# Patient Record
Sex: Male | Born: 2000
Health system: Southern US, Community
[De-identification: ages and names within clinical notes are randomized; demographics above are authoritative.]

## PROBLEM LIST (undated history)

## (undated) DIAGNOSIS — Q068 Other specified congenital malformations of spinal cord: Secondary | ICD-10-CM

## (undated) DIAGNOSIS — G43909 Migraine, unspecified, not intractable, without status migrainosus: Secondary | ICD-10-CM

## (undated) DIAGNOSIS — H6690 Otitis media, unspecified, unspecified ear: Secondary | ICD-10-CM

## (undated) DIAGNOSIS — F909 Attention-deficit hyperactivity disorder, unspecified type: Secondary | ICD-10-CM

## (undated) DIAGNOSIS — J45909 Unspecified asthma, uncomplicated: Secondary | ICD-10-CM

## (undated) HISTORY — PX: BACK SURGERY: SHX140

## (undated) HISTORY — PX: OTHER SURGICAL HISTORY: SHX169

---
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
## 2021-01-23 ENCOUNTER — Telehealth: Payer: Self-pay

## 2021-01-23 NOTE — Telephone Encounter (Signed)
Error

## 2021-01-23 NOTE — Telephone Encounter (Signed)
Caller name:Clayton Avery   On DPR? :Yes  Call back [phone]  Provider they see: Janeece Agee   Reason for call:Pt mother called in requesting refill for pt I explain to mother a DPR would have to be on file to discuss medical. I called the pt and LVM to return my phone call pt needs to schedule a Med Check in order to get adderall

## 2021-01-25 ENCOUNTER — Other Ambulatory Visit: Payer: Self-pay

## 2021-01-25 ENCOUNTER — Ambulatory Visit (INDEPENDENT_AMBULATORY_CARE_PROVIDER_SITE_OTHER): Payer: No Typology Code available for payment source | Admitting: Registered Nurse

## 2021-01-25 ENCOUNTER — Encounter: Payer: Self-pay | Admitting: Registered Nurse

## 2021-01-25 VITALS — BP 126/62 | HR 79 | Temp 98.3°F | Resp 18 | Ht 74.0 in | Wt 166.8 lb

## 2021-01-25 DIAGNOSIS — F909 Attention-deficit hyperactivity disorder, unspecified type: Secondary | ICD-10-CM

## 2021-01-25 MED ORDER — AMPHETAMINE-DEXTROAMPHETAMINE 20 MG PO TABS: 20.0000 mg | ORAL_TABLET | Freq: Every day | ORAL | 0 refills | Status: DC

## 2021-01-25 NOTE — Patient Instructions (Signed)
Mr. Rishabh Mcmurdie to see you  Restart adderall 20mg  daily Ok to split tabs in half and use 1/2 tab twice daily. Let me know if not working out [employer] you a message on MyChart in 3-4 weeks   Thanks,  Denice Paradise

## 2021-01-25 NOTE — Progress Notes (Signed)
Established Patient Office Visit  Subjective:  Patient ID: Clayton Avery, male    DOB: 2000-06-05  Age: 21 y.o. MRN: [id number]  CC:  Chief Complaint  Patient presents with   Medication Refill    Patient states he is here to discuss medication for ADHD . Patient states he was dx before but stopped when he started college. Pt states he is having a hard time in school.    HPI [name] presents for ADHD  Had been medicated in the past - as recently as 1 year ago .  Had been on Adderall 20mg  IR po qd in the past. Good effect.  Would like to resume  Has had some anxieties recently - have trialed meds without relief.  Notes some of his anxieties seem to be related to his inability to focus.  Past Medical History:  Diagnosis Date   ADHD (attention deficit hyperactivity disorder)    ADHD (attention deficit hyperactivity disorder)    Asthma    Migraine    Otitis    Tethered cord ([facility])     Past Surgical History:  Procedure Laterality Date   BACK SURGERY     LUMBAR LAMINECTOMY FOR TETHERED CORD RELEASE  2003   spinal cord surgery     tubes in ears      History reviewed. No pertinent family history.  Social History   Socioeconomic History   Marital status: Single    Spouse name: Not on file   Number of children: Not on file   Years of education: Not on file   Highest education level: Not on file  Occupational History   Not on file  Tobacco Use   Smoking status: Never   Smokeless tobacco: Never  Vaping Use   Vaping Use: Never used  Substance and Sexual Activity   Alcohol use: Yes    Comment: 3 x weekly   Drug use: Yes    Types: Marijuana    Comment: occasional   Sexual activity: Not Currently  Other Topics Concern   Not on file  Social History Narrative   ** Merged History Encounter **       Social Determinants of Health   Financial Resource Strain: Not on file  Food Insecurity: Not on file  Transportation Needs: Not on file   Physical Activity: Not on file  Stress: Not on file  Social Connections: Not on file  Intimate Partner Violence: Not on file    Outpatient Medications Prior to Visit  Medication Sig Dispense Refill   albuterol (VENTOLIN HFA) 108 (90 Base) MCG/ACT inhaler Inhale 2 puffs into the lungs every 6 (six) hours as needed for wheezing or shortness of breath. (Patient not taking: Reported on 12/27/2020) 8 g 0   busPIRone (BUSPAR) 5 MG tablet Take 1 tablet (5 mg total) by mouth 3 (three) times daily as needed (anxiety). (Patient not taking: Reported on 01/25/2021) 15 tablet 0   dicyclomine (BENTYL) 20 MG tablet Take 1 tablet (20 mg total) by mouth 3 (three) times daily as needed for spasms (cramping pain). (Patient not taking: Reported on 12/27/2020) 20 tablet 0   mirtazapine (REMERON) 7.5 MG tablet Take 1 tablet (7.5 mg total) by mouth at bedtime. (Patient not taking: Reported on 01/25/2021) 7 tablet 0   naproxen (NAPROSYN) 500 MG tablet Take 1 tablet (500 mg total) by mouth 2 (two) times daily with a meal. (Patient not taking: Reported on 01/25/2021) 30 tablet 0   ondansetron (ZOFRAN ODT) 4  MG disintegrating tablet Take 1 tablet (4 mg total) by mouth every 8 (eight) hours as needed. (Patient not taking: Reported on 12/27/2020) 20 tablet 0   Facility-Administered Medications Prior to Visit  Medication Dose Route Frequency Provider Last Rate Last Admin   ipratropium-albuterol (DUONEB) 0.5-2.5 (3) MG/3ML nebulizer solution 3 mL  3 mL Nebulization Once Janeece Agee, NP        No Known Allergies  ROS Review of Systems Per hpi    Objective:    Physical Exam Constitutional:      General: He is not in acute distress.    Appearance: Normal appearance. He is normal weight. He is not ill-appearing, toxic-appearing or diaphoretic.  Cardiovascular:     Rate and Rhythm: Normal rate and regular rhythm.     Heart sounds: Normal heart sounds. No murmur heard.   No friction rub. No gallop.  Pulmonary:      Effort: Pulmonary effort is normal. No respiratory distress.     Breath sounds: Normal breath sounds. No stridor. No wheezing, rhonchi or rales.  Chest:     Chest wall: No tenderness.  Neurological:     General: No focal deficit present.     Mental Status: He is alert and oriented to person, place, and time. Mental status is at baseline.  Psychiatric:        Mood and Affect: Mood normal.        Behavior: Behavior normal.        Thought Content: Thought content normal.        Judgment: Judgment normal.    BP 126/62    Pulse 79    Temp 98.3 F (36.8 C) (Temporal)    Resp 18    Ht 6\' 2"  (1.88 m)    Wt 166 lb 12.8 oz (75.7 kg)    SpO2 98%    BMI 21.42 kg/m  Wt Readings from Last 3 Encounters:  01/25/21 166 lb 12.8 oz (75.7 kg)  12/27/20 158 lb 6.4 oz (71.8 kg) (55 %, Z= 0.11)*  11/20/20 170 lb (77.1 kg) (71 %, Z= 0.54)*   * Growth percentiles are based on CDC (Boys, 2-20 Years) data.     Health Maintenance Due  Topic Date Due   HPV VACCINES (1 - Male 2-dose series) Never done   COVID-19 Vaccine (2 - Pfizer series) 05/07/2019   TETANUS/TDAP  Never done   INFLUENZA VACCINE  Never done       Topic Date Due   HPV VACCINES (1 - Male 2-dose series) Never done    Lab Results  Component Value Date   TSH 0.566 07/21/2019   Lab Results  Component Value Date   WBC 9.5 11/20/2020   HGB 16.8 11/20/2020   HCT 49.4 11/20/2020   MCV 83.9 11/20/2020   PLT 210 11/20/2020   Lab Results  Component Value Date   NA 133 (L) 11/20/2020   K 3.7 11/20/2020   CO2 24 11/20/2020   GLUCOSE 115 (H) 11/20/2020   BUN 12 11/20/2020   CREATININE 1.04 11/20/2020   BILITOT 1.7 (H) 11/20/2020   ALKPHOS 55 11/20/2020   AST 19 11/20/2020   ALT 18 11/20/2020   PROT 7.8 11/20/2020   ALBUMIN 4.5 11/20/2020   CALCIUM 9.0 11/20/2020   ANIONGAP 10 11/20/2020   No results found for: CHOL No results found for: HDL No results found for: LDLCALC No results found for: TRIG No results found for:  CHOLHDL No results found for: 11/22/2020  Assessment & Plan:   Problem List Items Addressed This Visit   None Visit Diagnoses     Attention deficit hyperactivity disorder (ADHD), unspecified ADHD type    -  Primary   Relevant Medications   amphetamine-dextroamphetamine (ADDERALL) 20 MG tablet       Meds ordered this encounter  Medications   amphetamine-dextroamphetamine (ADDERALL) 20 MG tablet    Sig: Take 1 tablet (20 mg total) by mouth daily.    Dispense:  30 tablet    Refill:  0    Order Specific Question:   Supervising Provider    Answer:   Neva Seat, JEFFREY R [[id number]]    Follow-up: Return in about 6 months (around 07/25/2021) for adderall check.   PLAN Restart adderall 20mg  po qd Med chcek in 3-4 weeks Visit in 6 mo Patient encouraged to call clinic with any questions, comments, or concerns.  , NP

## 2021-03-07 ENCOUNTER — Other Ambulatory Visit: Payer: Self-pay

## 2021-03-07 DIAGNOSIS — F909 Attention-deficit hyperactivity disorder, unspecified type: Secondary | ICD-10-CM

## 2021-03-07 MED ORDER — AMPHETAMINE-DEXTROAMPHETAMINE 20 MG PO TABS: 20.0000 mg | ORAL_TABLET | Freq: Every day | ORAL | 0 refills | Status: DC

## 2021-03-07 NOTE — Telephone Encounter (Signed)
Patient is requesting a refill of the following medications: ?Requested Prescriptions  ? ?Pending Prescriptions Disp Refills  ? amphetamine-dextroamphetamine (ADDERALL) 20 MG tablet 30 tablet 0  ?  Sig: Take 1 tablet (20 mg total) by mouth daily.  ? ? ?Date of patient request: 01/25/21 ?Last office visit: 01/25/21 ?Date of last refill: 01/25/21 ?Last refill amount: 30 ? ? ?

## 2021-03-07 NOTE — Telephone Encounter (Signed)
Encourage patient to contact the pharmacy for refills or they can request refills through [facility] ? ?(Please schedule appointment if patient has not been seen in over a year) ? ? ? ?WHAT PHARMACY WOULD THEY LIKE THIS SENT TO: [id number] - [place], [place] - [address]  ?[address], [place] [address]  ? ?MEDICATION NAME & DOSE:amphetamine-dextroamphetamine (ADDERALL) 20 MG tablet  ? ?NOTES/COMMENTS FROM PATIENT:Pt stated that Gerlene Burdock said to call him back for 90 day supply  ? ? ? ? ? ?Front office please notify patient: ?It takes 48-72 hours to process rx refill requests ?Ask patient to call pharmacy to ensure rx is ready before heading there.  ? ?

## 2021-03-13 MED ORDER — AMPHETAMINE-DEXTROAMPHETAMINE 20 MG PO TABS: 20.0000 mg | ORAL_TABLET | Freq: Every day | ORAL | 0 refills | Status: DC

## 2021-03-13 NOTE — Telephone Encounter (Signed)
Pt is calling for a follow up regarding medication refill  ?

## 2021-03-13 NOTE — Addendum Note (Signed)
Addended by: Janeece Agee on: 03/13/2021 05:05 PM ? ? Modules accepted: Orders ? ?

## 2021-03-15 ENCOUNTER — Other Ambulatory Visit (HOSPITAL_BASED_OUTPATIENT_CLINIC_OR_DEPARTMENT_OTHER): Payer: Self-pay

## 2021-03-15 MED ORDER — AMPHETAMINE-DEXTROAMPHETAMINE 20 MG PO TABS
20.0000 mg | ORAL_TABLET | Freq: Every day | ORAL | 0 refills | Status: DC
  Filled 2021-03-15 – 2021-06-15 (×2): qty 30, 30d supply, fill #0

## 2021-03-15 NOTE — Addendum Note (Signed)
Addended by: Patrcia Dolly on: 03/15/2021 02:43 PM ? ? Modules accepted: Orders ? ?

## 2021-03-15 NOTE — Telephone Encounter (Signed)
Pt would like this to go to [facility] due to insurance  ?

## 2021-03-22 ENCOUNTER — Other Ambulatory Visit: Payer: Self-pay

## 2021-03-22 ENCOUNTER — Emergency Department: Admit: 2021-03-22 | Discharge status: Absent without leave | Payer: Self-pay

## 2021-03-22 ENCOUNTER — Emergency Department (INDEPENDENT_AMBULATORY_CARE_PROVIDER_SITE_OTHER)
Admission: EM | Admit: 2021-03-22 | Discharge: 2021-03-22 | Disposition: A | Discharge status: Home / Self Care | Payer: No Typology Code available for payment source | Source: Home / Self Care | Attending: Family Medicine | Admitting: Family Medicine

## 2021-03-22 ENCOUNTER — Emergency Department (INDEPENDENT_AMBULATORY_CARE_PROVIDER_SITE_OTHER): Payer: No Typology Code available for payment source

## 2021-03-22 DIAGNOSIS — M25522 Pain in left elbow: Secondary | ICD-10-CM | POA: Diagnosis not present

## 2021-03-22 DIAGNOSIS — G5622 Lesion of ulnar nerve, left upper limb: Secondary | ICD-10-CM | POA: Diagnosis not present

## 2021-03-22 MED ORDER — METHYLPREDNISOLONE 4 MG PO TBPK: ORAL_TABLET | ORAL | 0 refills | Status: DC

## 2021-03-22 NOTE — ED Triage Notes (Signed)
Pt presents with left elbow pain and weakness that began while lifting weights at the gym today ?

## 2021-03-22 NOTE — ED Provider Notes (Signed)
?[facility] ? ? ? ?CSN: [id number] ?Arrival date & time: 03/22/21  1618 ? ? ?  ? ?History   ?Chief Complaint ?Chief Complaint  ?Patient presents with  ? Elbow Pain  ?  left  ? ? ?HPI ?Clayton Avery is a 21 y.o. male.  ? ?HPI ? ?Healthy 21 year old.  Likes to workout in the gym.  States he was working out in [employer] gym today and had a weight in his hands.  With elbows bent he was rotating his hands outward and felt a sudden severe pain in his elbow and had to stop.  He rubbed the elbow for minutes and then picked up the way to try to do bicep curls.  He states that his left and refused to cooperate and he dropped the weight.  He noted numbness in the third fourth and fifth fingers.  His elbow has been painful ever since then.  He states the numbness is improved.  He has never had this problem before. ? ?Past Medical History:  ?Diagnosis Date  ? ADHD (attention deficit hyperactivity disorder)   ? ADHD (attention deficit hyperactivity disorder)   ? Asthma   ? Migraine   ? Otitis   ? Tethered cord (HCC)   ? ? ?Patient Active Problem List  ? Diagnosis Date Noted  ? Finger injury, right, initial encounter 02/21/2016  ? ? ?Past Surgical History:  ?Procedure Laterality Date  ? BACK SURGERY    ? LUMBAR LAMINECTOMY FOR TETHERED CORD RELEASE  2003  ? spinal cord surgery    ? tubes in ears    ? ? ? ? ? ?Home Medications   ? ?Prior to Admission medications   ?Medication Sig Start Date End Date Taking? Authorizing Provider  ?methylPREDNISolone (MEDROL DOSEPAK) 4 MG TBPK tablet tad 03/22/21  Yes Eustace MooreNelson, Tatem Holsonback Sue, MD  ?amphetamine-dextroamphetamine (ADDERALL) 20 MG tablet Take 1 tablet (20 mg total) by mouth daily. 03/15/21   Janeece AgeeMorrow, Richard, NP  ? ? ?Family History ?History reviewed. No pertinent family history. ? ?Social History ?Social History  ? ?Tobacco Use  ? Smoking status: Never  ? Smokeless tobacco: Never  ?Vaping Use  ? Vaping Use: Never used  ?Substance Use Topics  ? Alcohol use: Yes  ?  Comment: 3 x  weekly  ? Drug use: Yes  ?  Types: Marijuana  ?  Comment: occasional  ? ? ? ?Allergies   ?Patient has no known allergies. ? ? ?Review of Systems ?Review of Systems ?See HPI ? ?Physical Exam ?Triage Vital Signs ?ED Triage Vitals  ?Enc Vitals Group  ?   BP 03/22/21 1634 129/86  ?   Pulse Rate 03/22/21 1634 78  ?   Resp 03/22/21 1634 12  ?   Temp 03/22/21 1634 98.2 ?F (36.8 ?C)  ?   Temp src --   ?   SpO2 03/22/21 1634 100 %  ?   Weight --   ?   Height --   ?   Head Circumference --   ?   Peak Flow --   ?   Pain Score 03/22/21 1635 3  ?   Pain Loc --   ?   Pain Edu? --   ?   Excl. in GC? --   ? ?No data found. ? ?Updated Vital Signs ?BP 129/86 (BP Location: Right Arm)   Pulse 78   Temp 98.2 ?F (36.8 ?C)   Resp 12   SpO2 100%  ?   ? ?Physical  Exam ?Constitutional:   ?   General: He is not in acute distress. ?   Appearance: Normal appearance. He is well-developed and normal weight.  ?HENT:  ?   Head: Normocephalic and atraumatic.  ?   Nose:  ?   Comments: Is wearing mask ?Eyes:  ?   Conjunctiva/sclera: Conjunctivae normal.  ?   Pupils: Pupils are equal, round, and reactive to light.  ?Cardiovascular:  ?   Rate and Rhythm: Normal rate.  ?Pulmonary:  ?   Effort: Pulmonary effort is normal. No respiratory distress.  ?Abdominal:  ?   General: There is no distension.  ?   Palpations: Abdomen is soft.  ?Musculoskeletal:     ?   General: Swelling and tenderness present. Normal range of motion.  ?   Cervical back: Normal range of motion.  ?   Comments: Left elbow has slight swelling medially.  There is tenderness over the medial epicondyles.  He has full range of motion.  Normal flexion extension, normal pronation supination of forearm.  Normal ability to move against resistance.  Normal sensation in hand.  Tenderness, positive Tinel's and ulnar groove over nerve.  ?Skin: ?   General: Skin is warm and dry.  ?   Findings: No erythema.  ?Neurological:  ?   General: No focal deficit present.  ?   Mental Status: He is alert.  ?    Sensory: No sensory deficit.  ?   Motor: No weakness.  ?   Gait: Gait normal.  ? ? ? ?UC Treatments / Results  ?Labs ?(all labs ordered are listed, but only abnormal results are displayed) ?Labs Reviewed - No data to display ? ?EKG ? ? ?Radiology ?DG Elbow Complete Left ? ?Result Date: 03/22/2021 ?CLINICAL DATA:  Medial epicondyle pain.  Injury. EXAM: LEFT ELBOW - COMPLETE 3+ VIEW COMPARISON:  None. FINDINGS: There is no evidence of fracture, dislocation, or joint effusion. Normal alignment and joint spaces. There is no evidence of arthropathy or other focal bone abnormality. Minor medial soft tissue edema. IMPRESSION: Minor medial soft tissue edema. No osseous abnormality. Electronically Signed   By: Narda Rutherford M.D.   On: 03/22/2021 17:21   ? ?Procedures ?Procedures (including critical care time) ? ?Medications Ordered in UC ?Medications - No data to display ? ?Initial Impression / Assessment and Plan / UC Course  ?I have reviewed the triage vital signs and the nursing notes. ? ?Pertinent labs & imaging results that were available during my care of the patient were reviewed by me and considered in my medical decision making (see chart for details). ? ?  ? ?Uncertain injury.  Symptoms consistent with ulnar nerve inflammation.  We will treat with ice, rest, steroids.  Follow-up with sports medicine ?Final Clinical Impressions(s) / UC Diagnoses  ? ?Final diagnoses:  ?Ulnar neuropathy at elbow of left upper extremity  ? ? ? ?Discharge Instructions   ? ?  ?Wear sling and limit use of arm until pain and numbness have resolved ?Ice for 20 minutes every few hours ?Take the Medrol Dosepak as prescribed.  Take all of day 1 today.  This is a steroid to reduce inflammation and nerve symptoms ?Follow-up with sports medicine if any pain or numbness recur.  If worse instead of better.  If you need advice regarding athletic activity ? ? ?ED Prescriptions   ? ? Medication Sig Dispense Auth. Provider  ? methylPREDNISolone  (MEDROL DOSEPAK) 4 MG TBPK tablet tad 21 tablet Eustace Moore, MD  ? ?  ? ?  PDMP not reviewed this encounter. ?  ?Eustace Moore, MD ?03/22/21 1802 ? ?

## 2021-03-22 NOTE — Discharge Instructions (Addendum)
Wear sling and limit use of arm until pain and numbness have resolved ?Ice for 20 minutes every few hours ?Take the Medrol Dosepak as prescribed.  Take all of day 1 today.  This is a steroid to reduce inflammation and nerve symptoms ?Follow-up with sports medicine if any pain or numbness recur.  If worse instead of better.  If you need advice regarding athletic activity ?

## 2021-05-02 IMAGING — DX DG CHEST 2V
3 series · 3 of 3 positions shown · non-contrast
Comparison: None.

CLINICAL DATA: Chest pain and cough for 3 weeks

EXAM:
CHEST - 2 VIEW

[chest pa (1 of 2)]
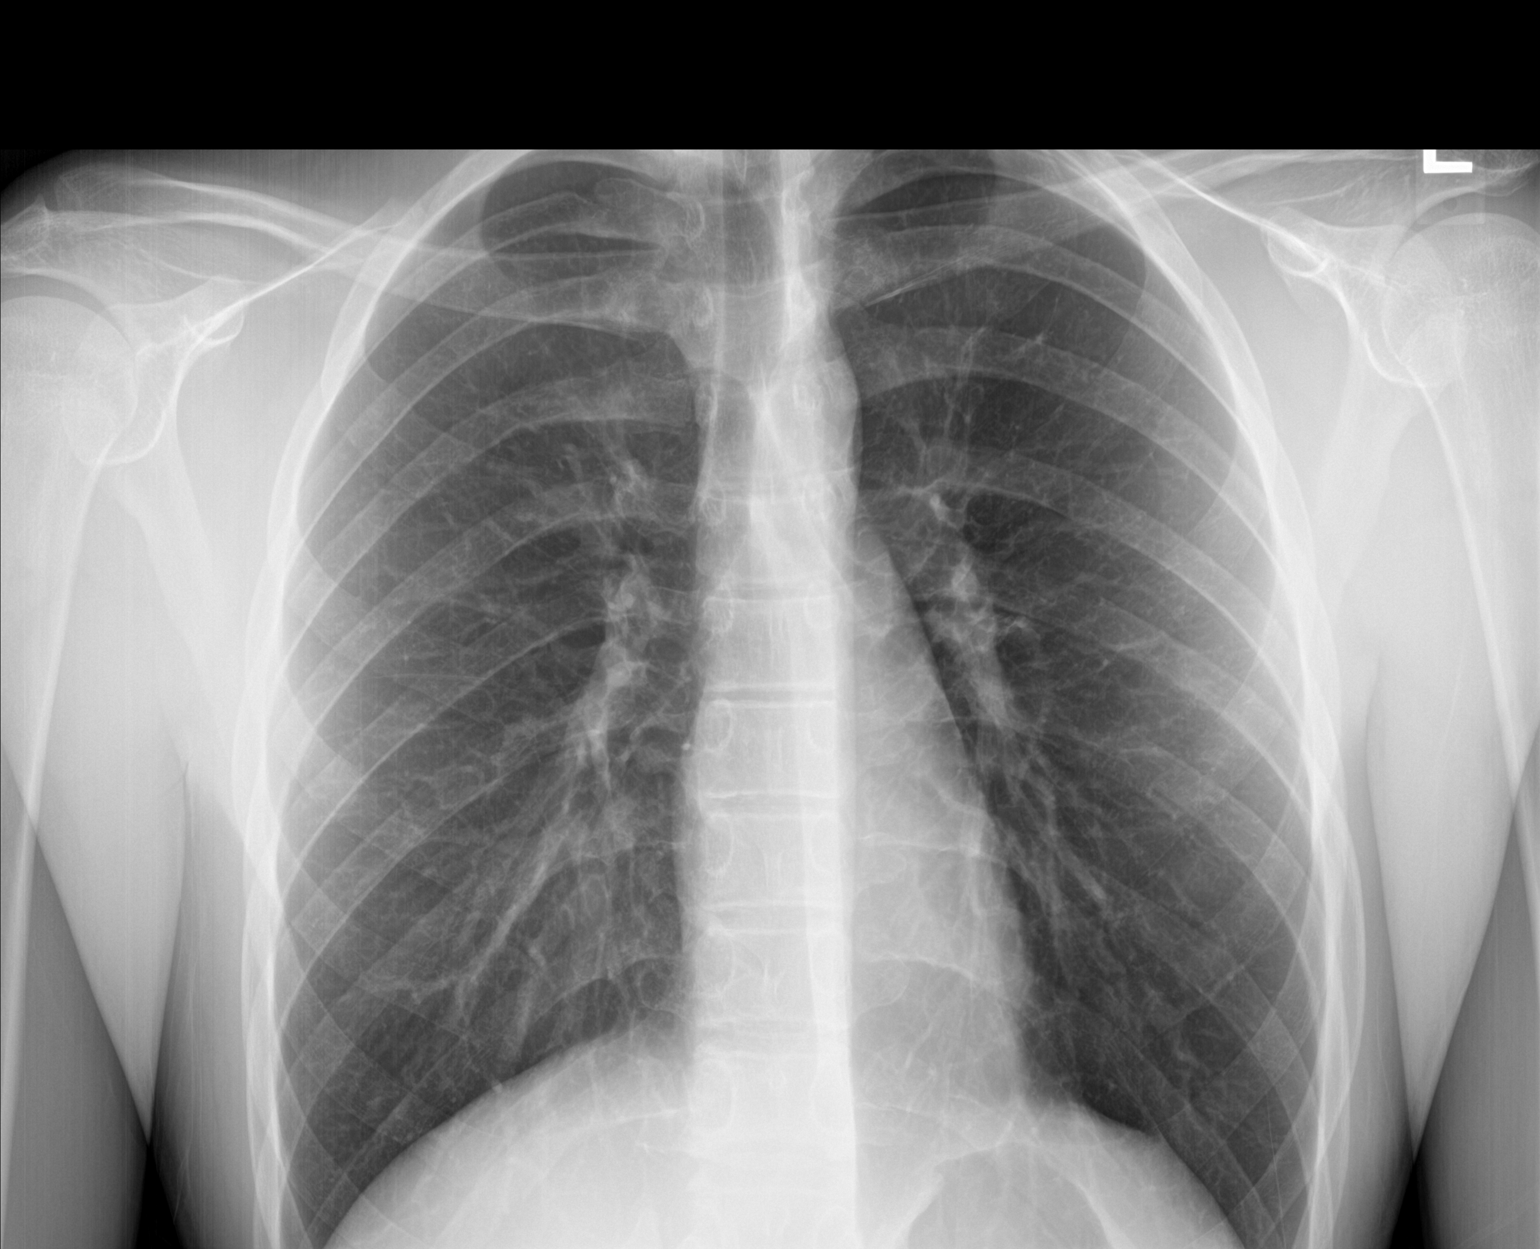

[chest lat]
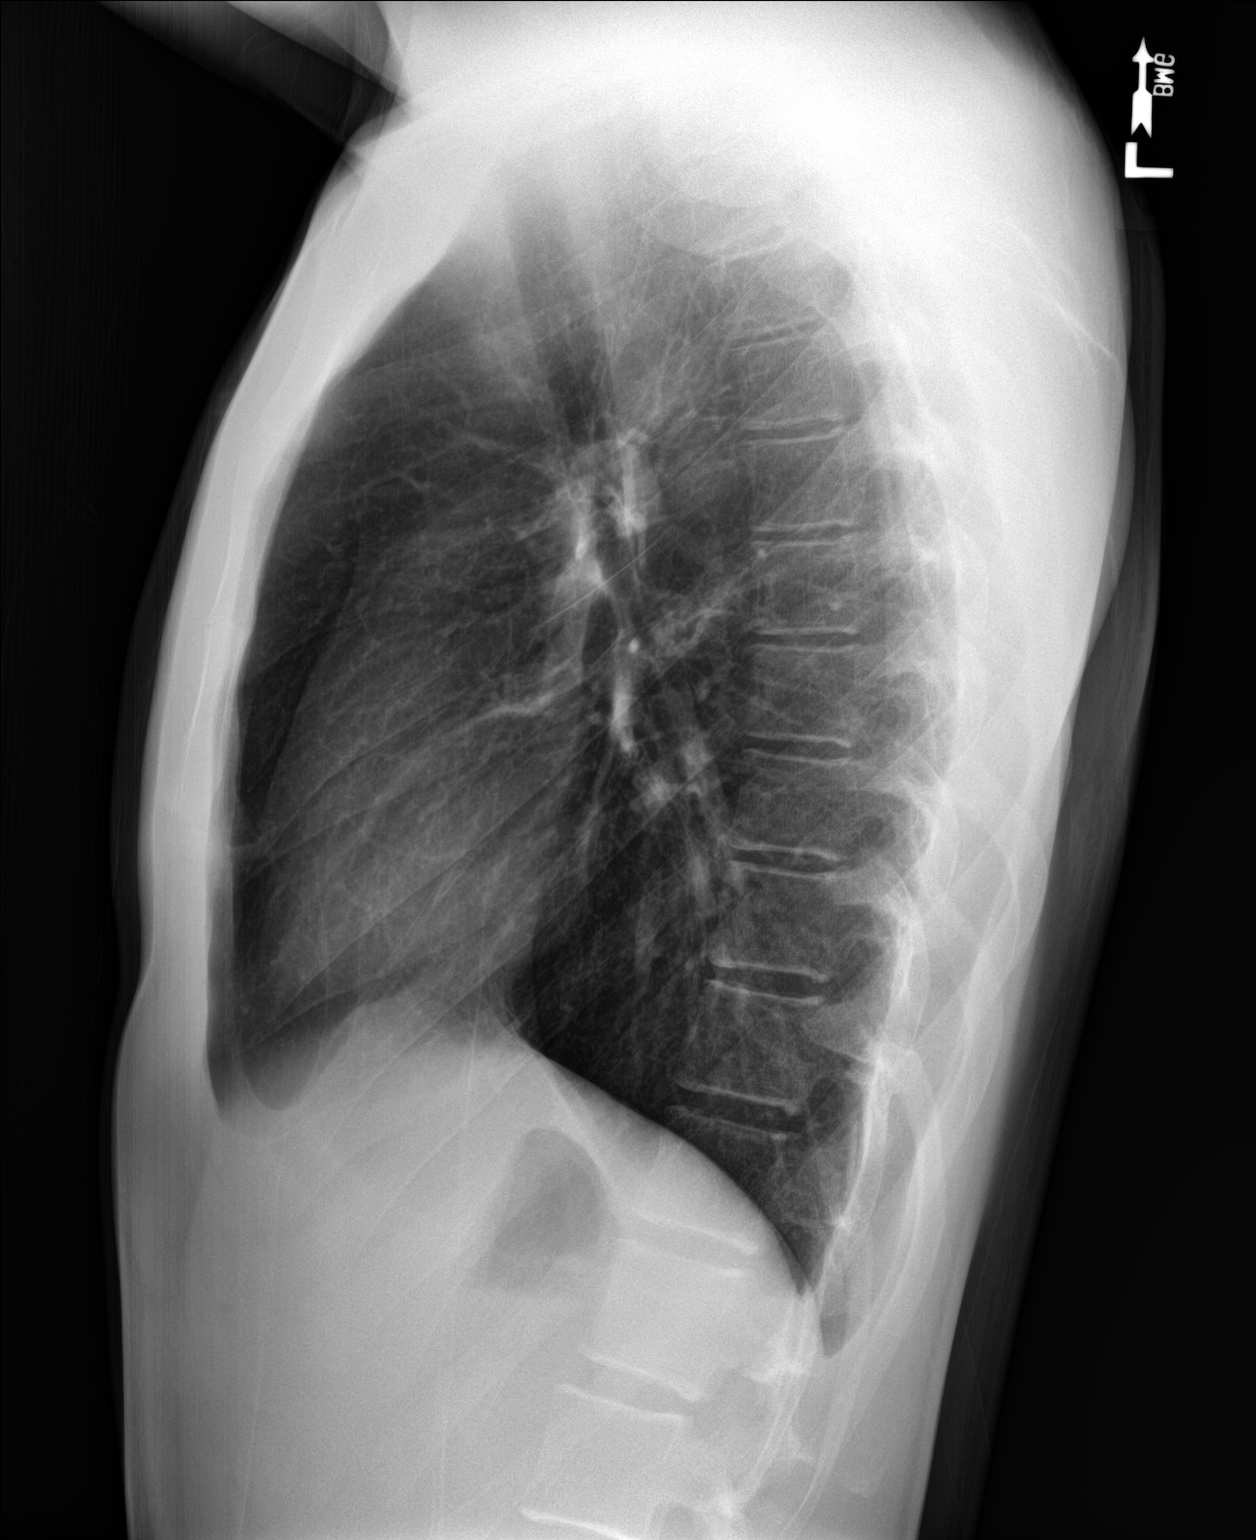

[chest pa (2 of 2)]
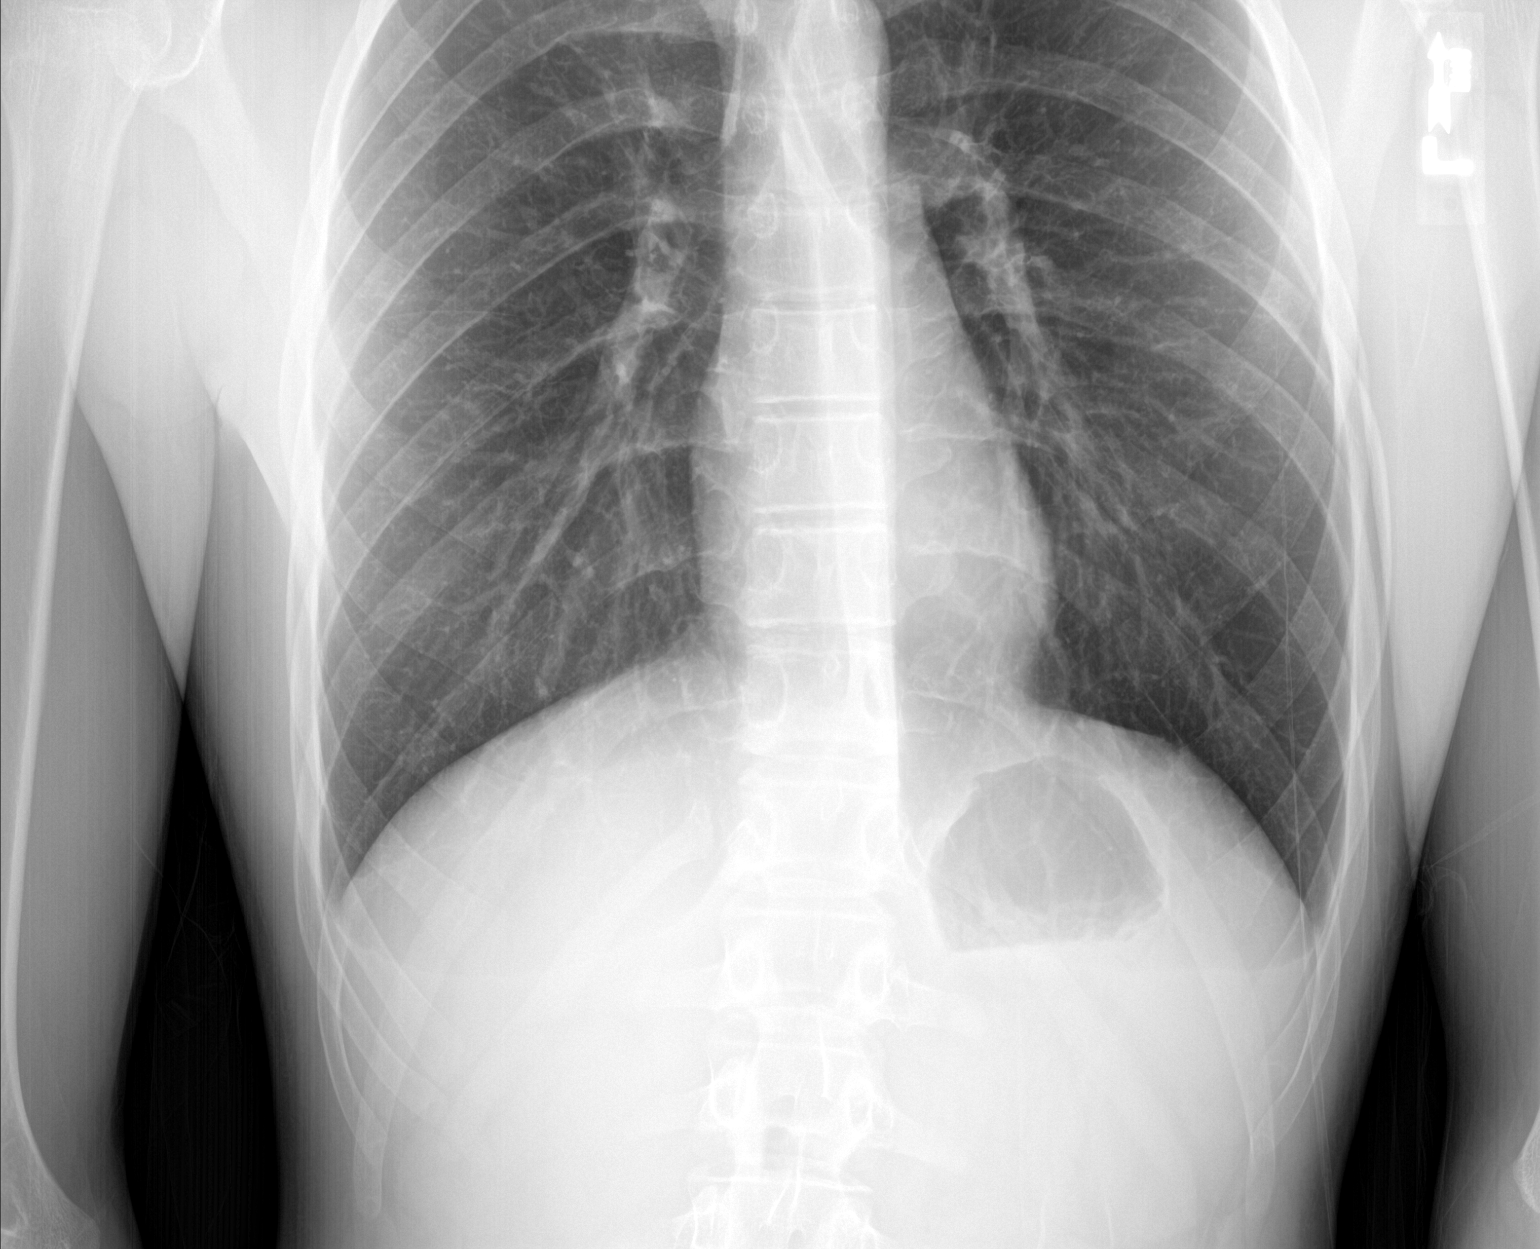

[3 of 3 positions shown; findings below may reference images not displayed]

FINDINGS: No consolidation, features of edema, pneumothorax, or effusion.
Pulmonary vascularity is normally distributed. The cardiomediastinal
contours are unremarkable. No acute osseous or soft tissue
abnormality.
IMPRESSION: No acute cardiopulmonary abnormality.

## 2021-06-15 ENCOUNTER — Other Ambulatory Visit (HOSPITAL_BASED_OUTPATIENT_CLINIC_OR_DEPARTMENT_OTHER): Payer: Self-pay

## 2021-06-28 ENCOUNTER — Encounter (HOSPITAL_BASED_OUTPATIENT_CLINIC_OR_DEPARTMENT_OTHER): Payer: Self-pay

## 2021-06-28 ENCOUNTER — Other Ambulatory Visit: Payer: Self-pay

## 2021-06-28 ENCOUNTER — Emergency Department (HOSPITAL_BASED_OUTPATIENT_CLINIC_OR_DEPARTMENT_OTHER)
Admission: EM | Admit: 2021-06-28 | Discharge: 2021-06-28 | Disposition: A | Discharge status: Home / Self Care | Payer: No Typology Code available for payment source | Attending: Emergency Medicine | Admitting: Emergency Medicine

## 2021-06-28 ENCOUNTER — Emergency Department (HOSPITAL_BASED_OUTPATIENT_CLINIC_OR_DEPARTMENT_OTHER): Payer: No Typology Code available for payment source | Admitting: Radiology

## 2021-06-28 DIAGNOSIS — M25522 Pain in left elbow: Secondary | ICD-10-CM | POA: Diagnosis present

## 2021-06-28 NOTE — Discharge Instructions (Signed)
Recommend following up with an orthopedic doctor such as [facility] or with sports medicine doctor such as Dr. Jordan Likes.  Call one of their offices to request a follow-up appointment for your elbow pain.  Take Tylenol and Motrin for pain.  Rest, ice, elevate.  Return to ER as needed.

## 2021-06-28 NOTE — ED Triage Notes (Signed)
Patient here POV from Home.  Endorses having Pain to Left Elbow that has been present for Several Months. States he originally had a Joint Effusion but never had Formal Care for Pain. States he was doing Pull-Ups today when he felt a Sudden Onset of Pain.   NAD Noted during Triage. A&Ox4. GCS 15. Ambulatory.

## 2021-06-29 ENCOUNTER — Ambulatory Visit: Payer: Self-pay

## 2021-06-29 ENCOUNTER — Encounter: Payer: Self-pay | Admitting: Family Medicine

## 2021-06-29 ENCOUNTER — Ambulatory Visit (INDEPENDENT_AMBULATORY_CARE_PROVIDER_SITE_OTHER): Payer: No Typology Code available for payment source | Admitting: Family Medicine

## 2021-06-29 VITALS — BP 120/62 | Ht 74.0 in | Wt 179.0 lb

## 2021-06-29 DIAGNOSIS — G5622 Lesion of ulnar nerve, left upper limb: Secondary | ICD-10-CM | POA: Insufficient documentation

## 2021-06-29 DIAGNOSIS — S5402XA Injury of ulnar nerve at forearm level, left arm, initial encounter: Secondary | ICD-10-CM

## 2021-06-29 DIAGNOSIS — M25522 Pain in left elbow: Secondary | ICD-10-CM

## 2021-06-29 MED ORDER — GABAPENTIN 300 MG PO CAPS
300.0000 mg | ORAL_CAPSULE | Freq: Three times a day (TID) | ORAL | 1 refills | Status: DC
Start: 2021-06-29 — End: 2021-08-22

## 2021-06-29 MED ORDER — IBUPROFEN 600 MG PO TABS: 600.0000 mg | ORAL_TABLET | Freq: Three times a day (TID) | ORAL | 1 refills | Status: DC | PRN

## 2021-06-29 NOTE — Assessment & Plan Note (Signed)
Acute on chronic in nature.  Initially had an injury about 5 months ago with weight lifting.  Since that time has had repetitive pain in the cubital tunnel with any form of resistance training.  Initially was seen in urgent care in March and provided medication and exercise at that time.  Has been under greater than 6 weeks of physician directed home exercise therapy as well as trying to rest. -Counseled on home exercise therapy and supportive care. -Gabapentin. -Ibuprofen. -MRI of the left elbow to evaluate for ulnar nerve damage, subluxation and for presurgical planning.

## 2021-06-29 NOTE — Patient Instructions (Signed)
Nice to meet you  Please consider compression. Please try the exercises. You could start with taking gabapentin at night.  You can increase to 2-3 times daily as you tolerate. We will get the MRI at [facility] imaging. Please send me a message in MyChart with any questions or updates.  We will set up a virtual visit once the MRI is resulted..   --Dr. Jordan Likes

## 2021-06-29 NOTE — Progress Notes (Signed)
  Clayton Avery - 21 y.o. male MRN [id number]  Date of birth: [birth year]  SUBJECTIVE:  Including CC & ROS.  No chief complaint on file.   Clayton Avery is a 21 y.o. male that is presenting with acute on chronic left elbow pain.  The pain initially started about 5 months ago.  He has been seen in the urgent care as well as emergency department for the same issue.  Imaging has been unrevealing and the symptoms are continued with any weight lifting.  Review of the urgent care note from 3/22 shows he was provided a sling and prednisone. Review of the emergency department note from 6/28 shows he was counseled supportive care. Independent review of the left elbow x-ray from 3/22 shows minor medial soft tissue. Independent review of the left elbow x-ray from 6/28 shows no acute changes.  Review of Systems See HPI   HISTORY: Past Medical, Surgical, Social, and Family History Reviewed & Updated per EMR.   Pertinent Historical Findings include:  Past Medical History:  Diagnosis Date   ADHD (attention deficit hyperactivity disorder)    ADHD (attention deficit hyperactivity disorder)    Asthma    Migraine    Otitis    Tethered cord (HCC)     Past Surgical History:  Procedure Laterality Date   BACK SURGERY     LUMBAR LAMINECTOMY FOR TETHERED CORD RELEASE  2003   spinal cord surgery     tubes in ears       PHYSICAL EXAM:  VS: BP 120/62 (BP Location: Left Arm, Patient Position: Sitting)   Ht 6\' 2"  (1.88 m)   Wt 179 lb (81.2 kg)   BMI 22.98 kg/m  Physical Exam Gen: NAD, alert, cooperative with exam, well-appearing MSK:  Left elbow: Instability with valgus and varus stress testing. Positive Tinel's test at the cubital tunnel. Weakness with grip strength. Weakness with resistance to pronation and supination Neurovascularly intact    Limited ultrasound: Left elbow:  Ulnar nerve within the cubital tunnel appears to be hypoechoic with mild enlargement. Normal appearing  medial epicondyle. No effusion within the elbow joint.  Summary: Findings consistent with ulnar nerve damage  Ultrasound and interpretation by , MD    ASSESSMENT & PLAN:   Ulnar nerve damage, left, initial encounter Acute on chronic in nature.  Initially had an injury about 5 months ago with weight lifting.  Since that time has had repetitive pain in the cubital tunnel with any form of resistance training.  Initially was seen in urgent care in March and provided medication and exercise at that time.  Has been under greater than 6 weeks of physician directed home exercise therapy as well as trying to rest. -Counseled on home exercise therapy and supportive care. -Gabapentin. -Ibuprofen. -MRI of the left elbow to evaluate for ulnar nerve damage, subluxation and for presurgical planning.

## 2021-06-30 NOTE — ED Provider Notes (Signed)
[facility] EMERGENCY DEPT Provider Note   CSN: [id number] Arrival date & time: 06/28/21  1903     History  Chief Complaint  Patient presents with   Elbow Pain    Clayton Avery is a 21 y.o. male.  Presented to ER due to concern for elbow pain.  Has been dealing with elbow pain on and off for the past 5 months.  Had initial injury.  Symptoms got better after some rest.  But he continued to have some intermittent pain.  Today while completing pull-ups he felt a sudden onset of pain in his left elbow.  Pain is currently mild to moderate, it is worse with movement and improved with rest.  He denies any numbness or weakness or tingling in his arms or legs.  He denies any other injuries.  He did not hit his head.  HPI     Home Medications Prior to Admission medications   Medication Sig Start Date End Date Taking? Authorizing Provider  amphetamine-dextroamphetamine (ADDERALL) 20 MG tablet Take 1 tablet (20 mg total) by mouth daily. 03/15/21   Janeece Agee, NP  gabapentin (NEURONTIN) 300 MG capsule Take 1 capsule (300 mg total) by mouth 3 (three) times daily. 06/29/21   Myra Rude, MD  ibuprofen (ADVIL) 600 MG tablet Take 1 tablet (600 mg total) by mouth every 8 (eight) hours as needed. 06/29/21   Myra Rude, MD  methylPREDNISolone (MEDROL DOSEPAK) 4 MG TBPK tablet tad 03/22/21   Eustace Moore, MD      Allergies    Patient has no known allergies.    Review of Systems   Review of Systems  Musculoskeletal:  Positive for arthralgias.  All other systems reviewed and are negative.   Physical Exam Updated Vital Signs BP 131/75 (BP Location: Right Arm)   Pulse 70   Temp 98.1 F (36.7 C) (Temporal)   Resp 16   Ht 6\' 2"  (1.88 m)   Wt 75.7 kg   SpO2 100%   BMI 21.43 kg/m  Physical Exam Vitals and nursing note reviewed.  Constitutional:      General: He is not in acute distress.    Appearance: He is well-developed.  HENT:     Head:  Normocephalic and atraumatic.  Eyes:     Conjunctiva/sclera: Conjunctivae normal.  Cardiovascular:     Rate and Rhythm: Normal rate.     Pulses: Normal pulses.  Pulmonary:     Effort: Pulmonary effort is normal. No respiratory distress.  Musculoskeletal:        General: No swelling.     Cervical back: Neck supple.     Comments: Left upper extremity: Some mild tenderness to palpation of his left elbow, there is no obvious deformity, joint ROM is normal  Skin:    General: Skin is warm and dry.     Capillary Refill: Capillary refill takes less than 2 seconds.  Neurological:     Mental Status: He is alert.     Comments: Sensation to left arm intact throughout  Psychiatric:        Mood and Affect: Mood normal.     ED Results / Procedures / Treatments   Labs (all labs ordered are listed, but only abnormal results are displayed) Labs Reviewed - No data to display  EKG None  Radiology DG Elbow Complete Left  Result Date: 06/28/2021 CLINICAL DATA:  Left elbow pain EXAM: LEFT ELBOW - COMPLETE 3+ VIEW COMPARISON:  None Available. FINDINGS: There is  no evidence of fracture, dislocation, or joint effusion. There is no evidence of arthropathy or other focal bone abnormality. Soft tissues are unremarkable. IMPRESSION: Negative. Electronically Signed   By: Helyn Numbers M.D.   On: 06/28/2021 19:49    Procedures Procedures    Medications Ordered in ED Medications - No data to display  ED Course/ Medical Decision Making/ A&P                           Medical Decision Making Amount and/or Complexity of Data Reviewed Radiology: ordered.   21 year old male presenting to ER due to concern for left elbow pain.  Reports acute injury today but has been dealing with pain for the past few months.  On physical exam there is no obvious deformity noted though he did have some tenderness to his elbow.  Independently reviewed and interpreted his x-ray, no acute dislocation or fracture noted.   Suspect MSK strain. For now I recommend patient rest, ice, elevate his elbow.  Feel he would benefit from follow-up with either orthopedic or sports medicine provider.  Provided info for out pt f/u.   After the discussed management above, the patient was determined to be safe for discharge.  The patient was in agreement with this plan and all questions regarding their care were answered.  ED return precautions were discussed and the patient will return to the ED with any significant worsening of condition.         Final Clinical Impression(s) / ED Diagnoses Final diagnoses:  Left elbow pain    Rx / DC Orders ED Discharge Orders     None         Milagros Loll, MD 06/30/21 1942

## 2021-07-17 ENCOUNTER — Ambulatory Visit
Admission: RE | Admit: 2021-07-17 | Discharge: 2021-07-17 | Disposition: A | Discharge status: Home / Self Care | Payer: No Typology Code available for payment source | Source: Ambulatory Visit | Attending: Family Medicine | Admitting: Family Medicine

## 2021-07-17 ENCOUNTER — Other Ambulatory Visit: Payer: Self-pay | Admitting: Registered Nurse

## 2021-07-17 DIAGNOSIS — S5402XA Injury of ulnar nerve at forearm level, left arm, initial encounter: Secondary | ICD-10-CM

## 2021-07-17 DIAGNOSIS — F909 Attention-deficit hyperactivity disorder, unspecified type: Secondary | ICD-10-CM

## 2021-07-18 ENCOUNTER — Telehealth (INDEPENDENT_AMBULATORY_CARE_PROVIDER_SITE_OTHER): Payer: No Typology Code available for payment source | Admitting: Family Medicine

## 2021-07-18 ENCOUNTER — Encounter: Payer: Self-pay | Admitting: Family Medicine

## 2021-07-18 ENCOUNTER — Other Ambulatory Visit (HOSPITAL_BASED_OUTPATIENT_CLINIC_OR_DEPARTMENT_OTHER): Payer: Self-pay

## 2021-07-18 DIAGNOSIS — G5622 Lesion of ulnar nerve, left upper limb: Secondary | ICD-10-CM | POA: Diagnosis not present

## 2021-07-18 MED ORDER — AMPHETAMINE-DEXTROAMPHETAMINE 20 MG PO TABS
20.0000 mg | ORAL_TABLET | Freq: Every day | ORAL | 0 refills | Status: DC
  Filled 2021-07-18: qty 30, 30d supply, fill #0

## 2021-07-18 NOTE — Telephone Encounter (Signed)
Last visit Last refill 06/15/21 for #30, no refills per pdmp.  Last visit 01/25/21   Due for visit after this refill  Jari Sportsman, NP

## 2021-07-18 NOTE — Assessment & Plan Note (Signed)
MRI was reassuring but still having pain. -Counseled on home exercise therapy and supportive care. -Pursue injection.

## 2021-07-18 NOTE — Progress Notes (Signed)
Virtual Visit via Video Note  I connected with Clayton Avery on 07/18/21 at  3:30 PM EDT by a video enabled telemedicine application and verified that I am speaking with the correct person using two identifiers.  Location: Patient: home Provider: office   I discussed the limitations of evaluation and management by telemedicine and the availability of in person appointments. The patient expressed understanding and agreed to proceed.  History of Present Illness:  Clayton Avery is a 21 year old male that is following up after the MRI of his left elbow.  This was not demonstrating any structural changes but he continues to be symptomatic.   Observations/Objective:   Assessment and Plan:  Ulnar neuritis: MRI was reassuring but still having pain. -Counseled on home exercise therapy and supportive care. -Pursue injection.  Follow Up Instructions:    I discussed the assessment and treatment plan with the patient. The patient was provided an opportunity to ask questions and all were answered. The patient agreed with the plan and demonstrated an understanding of the instructions.   The patient was advised to call back or seek an in-person evaluation if the symptoms worsen or if the condition fails to improve as anticipated.   Clayton Gandy, MD

## 2021-07-19 ENCOUNTER — Ambulatory Visit (INDEPENDENT_AMBULATORY_CARE_PROVIDER_SITE_OTHER): Payer: No Typology Code available for payment source | Admitting: Family Medicine

## 2021-07-19 ENCOUNTER — Ambulatory Visit: Payer: Self-pay

## 2021-07-19 ENCOUNTER — Encounter: Payer: Self-pay | Admitting: Family Medicine

## 2021-07-19 VITALS — BP 101/70 | Ht 74.0 in | Wt 179.0 lb

## 2021-07-19 DIAGNOSIS — G5622 Lesion of ulnar nerve, left upper limb: Secondary | ICD-10-CM | POA: Diagnosis not present

## 2021-07-19 MED ORDER — BETAMETHASONE SOD PHOS & ACET 6 (3-3) MG/ML IJ SUSP
6.0000 mg | Freq: Once | INTRAMUSCULAR | Status: AC
  Administered 2021-07-19: 6 mg via INTRA_ARTICULAR

## 2021-07-19 NOTE — Progress Notes (Signed)
  Clayton Avery - 21 y.o. male MRN [id number]  Date of birth: 10-17-2000  SUBJECTIVE:  Including CC & ROS.  No chief complaint on file.   Clayton Avery is a 21 y.o. male that is here for ulnar nerve injection.    Review of Systems See HPI   HISTORY: Past Medical, Surgical, Social, and Family History Reviewed & Updated per EMR.   Pertinent Historical Findings include:  Past Medical History:  Diagnosis Date   ADHD (attention deficit hyperactivity disorder)    ADHD (attention deficit hyperactivity disorder)    Asthma    Migraine    Otitis    Tethered cord (HCC)     Past Surgical History:  Procedure Laterality Date   BACK SURGERY     LUMBAR LAMINECTOMY FOR TETHERED CORD RELEASE  2003   spinal cord surgery     tubes in ears       PHYSICAL EXAM:  VS: BP 101/70 (BP Location: Left Arm, Patient Position: Sitting)   Ht 6\' 2"  (1.88 m)   Wt 179 lb (81.2 kg)   BMI 22.98 kg/m  Physical Exam Gen: NAD, alert, cooperative with exam, well-appearing MSK:  Neurovascularly intact    Limited ultrasound: Left elbow:  The ulnar nerve was seen in the cross-sectional view proximal to the cubital tunnel.  Appear to be enlarged in this area.  Summary: Ulnar neuritis  Ultrasound and interpretation by , MD  Aspiration/Injection Procedure Note Clayton Avery 2000/12/07  Procedure: Injection Indications: Left elbow pain  Procedure Details Consent: Risks of procedure as well as the alternatives and risks of each were explained to the (patient/caregiver).  Consent for procedure obtained. Time Out: Verified patient identification, verified procedure, site/side was marked, verified correct patient position, special equipment/implants available, medications/allergies/relevent history reviewed, required imaging and test results available.  Performed.  The area was cleaned with iodine and alcohol swabs.    The left ulnar nerve was injected using 2 cc of normal  saline, 2 cc of D5W,1 cc's of 6 mg betamethasone and 2 cc's of 0.25% bupivacaine with a 25 1 1/2" needle.  Ultrasound was used. Images were obtained in long views showing the injection.     A sterile dressing was applied.  Patient did tolerate procedure well.     ASSESSMENT & PLAN:   Ulnar neuropathy at elbow, left Completed hydrodissection today. May need to consider EMG.

## 2021-07-19 NOTE — Assessment & Plan Note (Signed)
Completed hydrodissection today. May need to consider EMG.

## 2021-07-19 NOTE — Patient Instructions (Signed)
Good to see you Please try the exercise   Please send me a message in MyChart with any questions or updates.  Please see me back in 2-3 weeks.   --Dr. Jordan Likes

## 2021-07-21 ENCOUNTER — Encounter: Payer: Self-pay | Admitting: Family Medicine

## 2021-08-10 ENCOUNTER — Other Ambulatory Visit: Payer: Self-pay | Admitting: Registered Nurse

## 2021-08-10 DIAGNOSIS — F909 Attention-deficit hyperactivity disorder, unspecified type: Secondary | ICD-10-CM

## 2021-08-10 NOTE — Telephone Encounter (Signed)
This medicine was discussed and restarted in January based on chart review with visit on 01/25/2021 with Janeece Agee.  Visit was recommended in 6 months.  Controlled substance database reviewed.  #30 of dextroamphetamine-amphetamine were filled on 07/18/2021, with office visit needed per refill note on 07/18/2021.  Too early for refill, and needs office visit.  Please schedule to discuss this medication and further refills.

## 2021-08-10 NOTE — Telephone Encounter (Signed)
Last Visit 01/25/21 Last filled - 07/18/21  30 Tablet

## 2021-08-11 ENCOUNTER — Other Ambulatory Visit (HOSPITAL_BASED_OUTPATIENT_CLINIC_OR_DEPARTMENT_OTHER): Payer: Self-pay

## 2021-08-11 ENCOUNTER — Encounter (HOSPITAL_BASED_OUTPATIENT_CLINIC_OR_DEPARTMENT_OTHER): Payer: Self-pay

## 2021-08-11 NOTE — Telephone Encounter (Signed)
Called to schedule appointment no answer, LM

## 2021-08-22 ENCOUNTER — Ambulatory Visit
Admission: RE | Admit: 2021-08-22 | Discharge: 2021-08-22 | Disposition: A | Discharge status: Home / Self Care | Payer: No Typology Code available for payment source | Source: Ambulatory Visit | Attending: Emergency Medicine | Admitting: Emergency Medicine

## 2021-08-22 ENCOUNTER — Ambulatory Visit (HOSPITAL_BASED_OUTPATIENT_CLINIC_OR_DEPARTMENT_OTHER)
Admission: RE | Admit: 2021-08-22 | Discharge: 2021-08-22 | Disposition: A | Discharge status: Home / Self Care | Payer: No Typology Code available for payment source | Source: Ambulatory Visit | Attending: Emergency Medicine | Admitting: Emergency Medicine

## 2021-08-22 VITALS — BP 119/74 | HR 72 | Temp 98.4°F | Resp 18

## 2021-08-22 DIAGNOSIS — U071 COVID-19: Secondary | ICD-10-CM | POA: Diagnosis not present

## 2021-08-22 DIAGNOSIS — R059 Cough, unspecified: Secondary | ICD-10-CM | POA: Insufficient documentation

## 2021-08-22 DIAGNOSIS — J029 Acute pharyngitis, unspecified: Secondary | ICD-10-CM | POA: Diagnosis not present

## 2021-08-22 DIAGNOSIS — R0989 Other specified symptoms and signs involving the circulatory and respiratory systems: Secondary | ICD-10-CM | POA: Insufficient documentation

## 2021-08-22 DIAGNOSIS — J988 Other specified respiratory disorders: Secondary | ICD-10-CM

## 2021-08-22 DIAGNOSIS — R509 Fever, unspecified: Secondary | ICD-10-CM | POA: Diagnosis not present

## 2021-08-22 LAB — RESP PANEL BY RT-PCR (FLU A&B, COVID) ARPGX2
Influenza A by PCR: NEGATIVE
Influenza B by PCR: NEGATIVE
SARS Coronavirus 2 by RT PCR: POSITIVE — AB

## 2021-08-22 LAB — POCT RAPID STREP A (OFFICE): Rapid Strep A Screen: NEGATIVE

## 2021-08-22 NOTE — ED Provider Notes (Signed)
[facility]    CSN: [id number] Arrival date & time: 08/22/21  1051    HISTORY   Chief Complaint  Patient presents with   Sore Throat    Entered by patient   HPI Clayton Avery is a pleasant, 21 y.o. male who presents to urgent care today. The pt c/o fever tmax 101, red, raw sore throat, bilateral ear pressure that started 4 days ago.  Patient denies known sick contacts.  Patient states he has had COVID-19 6 times, states his symptoms today feel somewhat different.  Patient denies nausea, vomiting, diarrhea, cough, headache, loss of taste or smell.  Patient states he has not tried any medications to alleviate his symptoms.  The history is provided by the patient.   Past Medical History:  Diagnosis Date   ADHD (attention deficit hyperactivity disorder)    ADHD (attention deficit hyperactivity disorder)    Asthma    Migraine    Otitis    Tethered cord [facility])    Patient Active Problem List   Diagnosis Date Noted   Ulnar neuropathy at elbow, left 06/29/2021   Alcohol use 10/26/2020   Generalized anxiety disorder 10/26/2020   Mood disorder (HCC) 10/26/2020   Finger injury, right, initial encounter 02/21/2016   Past Surgical History:  Procedure Laterality Date   BACK SURGERY     LUMBAR LAMINECTOMY FOR TETHERED CORD RELEASE  2003   spinal cord surgery     tubes in ears      Home Medications    Prior to Admission medications   Medication Sig Start Date End Date Taking? Authorizing Provider  amphetamine-dextroamphetamine (ADDERALL) 20 MG tablet Take 1 tablet (20 mg total) by mouth daily. 07/18/21   Janeece Agee, NP  gabapentin (NEURONTIN) 300 MG capsule Take 1 capsule (300 mg total) by mouth 3 (three) times daily. 06/29/21   Myra Rude, MD  ibuprofen (ADVIL) 600 MG tablet Take 1 tablet (600 mg total) by mouth every 8 (eight) hours as needed. 06/29/21   Myra Rude, MD  methylPREDNISolone (MEDROL DOSEPAK) 4 MG TBPK tablet tad 03/22/21   Eustace Moore, MD    Family History History reviewed. No pertinent family history. Social History Social History   Tobacco Use   Smoking status: Never   Smokeless tobacco: Never  Vaping Use   Vaping Use: Never used  Substance Use Topics   Alcohol use: Yes    Comment: 3 x weekly   Drug use: Not Currently    Types: Marijuana    Comment: occasional   Allergies   Patient has no known allergies.  Review of Systems Review of Systems Pertinent findings revealed after performing a 14 point review of systems has been noted in the history of present illness.  Physical Exam Triage Vital Signs ED Triage Vitals  Enc Vitals Group     BP 10/28/20 0827 (!) 147/82     Pulse Rate 10/28/20 0827 72     Resp 10/28/20 0827 18     Temp 10/28/20 0827 98.3 F (36.8 C)     Temp Source 10/28/20 0827 Oral     SpO2 10/28/20 0827 98 %     Weight --      Height --      Head Circumference --      Peak Flow --      Pain Score 10/28/20 0826 5     Pain Loc --      Pain Edu? --  Excl. in GC? --   No data found.  Updated Vital Signs BP 119/74 (BP Location: Left Arm)   Pulse 72   Temp 98.4 F (36.9 C) (Oral)   Resp 18   SpO2 96%   Physical Exam Vitals and nursing note reviewed.  Constitutional:      General: He is not in acute distress.    Appearance: Normal appearance. He is not ill-appearing.  HENT:     Head: Normocephalic and atraumatic.     Salivary Glands: Right salivary gland is not diffusely enlarged or tender. Left salivary gland is not diffusely enlarged or tender.     Right Ear: Tympanic membrane, ear canal and external ear normal. No drainage. No middle ear effusion. There is no impacted cerumen. Tympanic membrane is not erythematous or bulging.     Left Ear: Tympanic membrane, ear canal and external ear normal. No drainage.  No middle ear effusion. There is no impacted cerumen. Tympanic membrane is not erythematous or bulging.     Nose: Nose normal. No nasal deformity, septal  deviation, mucosal edema, congestion or rhinorrhea.     Right Turbinates: Not enlarged, swollen or pale.     Left Turbinates: Not enlarged, swollen or pale.     Right Sinus: No maxillary sinus tenderness or frontal sinus tenderness.     Left Sinus: No maxillary sinus tenderness or frontal sinus tenderness.     Mouth/Throat:     Lips: Pink. No lesions.     Mouth: Mucous membranes are moist. No oral lesions.     Pharynx: Uvula midline. Pharyngeal swelling, oropharyngeal exudate and posterior oropharyngeal erythema present. No uvula swelling.     Tonsils: No tonsillar exudate. 0 on the right. 0 on the left.  Eyes:     General: Lids are normal.        Right eye: No discharge.        Left eye: No discharge.     Extraocular Movements: Extraocular movements intact.     Conjunctiva/sclera: Conjunctivae normal.     Right eye: Right conjunctiva is not injected.     Left eye: Left conjunctiva is not injected.  Neck:     Trachea: Trachea and phonation normal.  Cardiovascular:     Rate and Rhythm: Normal rate and regular rhythm.     Pulses: Normal pulses.     Heart sounds: Normal heart sounds. No murmur heard.    No friction rub. No gallop.  Pulmonary:     Effort: Pulmonary effort is normal. No tachypnea, bradypnea, accessory muscle usage, prolonged expiration, respiratory distress or retractions.     Breath sounds: No stridor, decreased air movement or transmitted upper airway sounds. Examination of the left-lower field reveals decreased breath sounds. Decreased breath sounds present. No wheezing, rhonchi or rales.  Chest:     Chest wall: No tenderness.  Musculoskeletal:        General: Normal range of motion.     Cervical back: Normal range of motion and neck supple. Normal range of motion.  Lymphadenopathy:     Cervical: No cervical adenopathy.  Skin:    General: Skin is warm and dry.     Findings: No erythema or rash.  Neurological:     General: No focal deficit present.     Mental  Status: He is alert and oriented to person, place, and time.  Psychiatric:        Mood and Affect: Mood normal.        Behavior: Behavior  normal.     Visual Acuity Right Eye Distance:   Left Eye Distance:   Bilateral Distance:    Right Eye Near:   Left Eye Near:    Bilateral Near:     UC Couse / Diagnostics / Procedures:     Radiology No results found.  Procedures Procedures (including critical care time) EKG  Pending results:  Labs Reviewed  RESP PANEL BY RT-PCR (FLU A&B, COVID) ARPGX2 - Abnormal; Notable for the following components:      Result Value   SARS Coronavirus 2 by RT PCR POSITIVE (*)    All other components within normal limits  CULTURE, GROUP A STREP [facility])  POCT RAPID STREP A (OFFICE)    Medications Ordered in UC: Medications - No data to display  UC Diagnoses / Final Clinical Impressions(s)   I have reviewed the triage vital signs and the nursing notes.  Pertinent labs & imaging results that were available during my care of the patient were reviewed by me and considered in my medical decision making (see chart for details).    Final diagnoses:  Sore throat  Fever, unspecified fever cause  Acute pharyngitis, unspecified etiology  Respiratory infection   Chest x-ray was unremarkable.  Patient advised to wait for the results of his viral testing and throat culture for further treatment recommendations.  Until then, conservative care recommended.  Return precautions advised.  ED Prescriptions   None    PDMP not reviewed this encounter.  Disposition Upon Discharge:  Condition: stable for discharge home Home: take medications as prescribed; routine discharge instructions as discussed; follow up as advised.  Patient presented with an acute illness with associated systemic symptoms and significant discomfort requiring urgent management. In my opinion, this is a condition that a prudent lay person (someone who possesses an average knowledge of  health and medicine) may potentially expect to result in complications if not addressed urgently such as respiratory distress, impairment of bodily function or dysfunction of bodily organs.   Routine symptom specific, illness specific and/or disease specific instructions were discussed with the patient and/or caregiver at length.   As such, the patient has been evaluated and assessed, work-up was performed and treatment was provided in alignment with urgent care protocols and evidence based medicine.  Patient/parent/caregiver has been advised that the patient may require follow up for further testing and treatment if the symptoms continue in spite of treatment, as clinically indicated and appropriate.  If the patient was tested for COVID-19, Influenza and/or RSV, then the patient/parent/guardian was advised to isolate at home pending the results of his/her diagnostic coronavirus test and potentially longer if they're positive. I have also advised pt that if his/her COVID-19 test returns positive, it's recommended to self-isolate for at least 10 days after symptoms first appeared AND until fever-free for 24 hours without fever reducer AND other symptoms have improved or resolved. Discussed self-isolation recommendations as well as instructions for household member/close contacts as per the [facility] and [place] DHHS, and also gave patient the COVID packet with this information.  Patient/parent/caregiver has been advised to return to the [facility] or PCP in 3-5 days if no better; to PCP or the Emergency Department if new signs and symptoms develop, or if the current signs or symptoms continue to change or worsen for further workup, evaluation and treatment as clinically indicated and appropriate  The patient will follow up with their current PCP if and as advised. If the patient does not currently have a PCP we  will assist them in obtaining one.   The patient may need specialty follow up if the symptoms continue, in spite  of conservative treatment and management, for further workup, evaluation, consultation and treatment as clinically indicated and appropriate.  Patient/parent/caregiver verbalized understanding and agreement of plan as discussed.  All questions were addressed during visit.  Please see discharge instructions below for further details of plan.  Discharge Instructions:   Discharge Instructions      Please go to [facility] to have a chest x-ray done.  Please go to the main entrance and let them know that you were seen here at urgent care and that this has been ordered for you.  Once I receive the results of your chest x-ray I will let her know whether or not you require antibiotics at this time.  If antibiotics are needed, I will send a prescription to your pharmacy and have you begin taking them today.  The result of your viral PCR testing for COVID-19 and influenza will be posted to your MyChart once it is complete, typically this takes 6 to 12 hours.    If your influenza PCR test is positive, you will be contacted by phone.  Due to the duration of your symptoms, you would no longer benefit from antiviral therapy for influenza.     If your COVID-19 test is positive, you will be contacted by phone with further recommendations if any.  Because you do not have a history of being immune compromised, you are currently vaccinated for COVID-19, you are under the age of 20, you do not have a risk of severe disease due to COVID-19, antiviral treatment is not indicated.   Your strep test today is negative.  Streptococcal throat culture will be performed per our protocol.  The result of your throat culture will be posted to your MyChart once it is complete, this typically takes 3 to 5 days.     If your streptococcal throat culture is positive, you will be contacted by phone and antibiotics will be prescribed for you.     Please see the list below for recommended  medications, dosages and frequencies to provide relief of your current symptoms:    Advil, Motrin (ibuprofen): This is a good anti-inflammatory medication which addresses aches, pains and inflammation of the upper airways that causes sinus and nasal congestion as well as in the lower airways which makes your cough feel tight and sometimes burn.  I recommend that you take between 400 to 600 mg every 6-8 hours as needed.      Tylenol (acetaminophen): This is a good fever reducer.  If your body temperature rises above 101.5 as measured with a thermometer, it is recommended that you take 1,000 mg every 8 hours until your temperature falls below 101.5, please not take more than 3,000 mg of acetaminophen either as a separate medication or as in ingredient in an over-the-counter cold/flu preparation within a 24-hour period.      Robitussin, Mucinex (guaifenesin): This is an expectorant.  This helps break up chest congestion and loosen up thick nasal drainage making phlegm and drainage more liquid and therefore easier to remove.  I recommend being 400 mg three times daily as needed.      Please follow-up within the next 5-7 days either with your primary care provider or urgent care if your symptoms do not resolve.  If you do not have a primary care provider, we will assist you in finding  one.   Thank you for visiting urgent care today.  We appreciate the opportunity to participate in your care.  On a side note, I see in your medical records that you used to see Dr. Alvy Bimler at [place].  I am aware that this office closed but I wanted to let you know that Dr. Alvy Bimler is now practicing at [facility] health if you are interested in returning to see him.    This office note has been dictated using Teaching laboratory technician.  Unfortunately, this method of dictation can sometimes lead to typographical or grammatical errors.  I apologize for your inconvenience in advance if this occurs.  Please do not hesitate  to reach out to me if clarification is needed.      Theadora Rama Scales, [place] 08/25/21 [phone]

## 2021-08-22 NOTE — ED Triage Notes (Signed)
The pt c/o sore throat, bilateral ear discomfort that stated Saturday.

## 2021-08-22 NOTE — Discharge Instructions (Addendum)
Please go to [facility] at [facility] to have a chest x-ray done.  Please go to the main entrance and let them know that you were seen here at urgent care and that this has been ordered for you.  Once I receive the results of your chest x-ray I will let her know whether or not you require antibiotics at this time.  If antibiotics are needed, I will send a prescription to your pharmacy and have you begin taking them today.  The result of your viral PCR testing for COVID-19 and influenza will be posted to your MyChart once it is complete, typically this takes 6 to 12 hours.    If your influenza PCR test is positive, you will be contacted by phone.  Due to the duration of your symptoms, you would no longer benefit from antiviral therapy for influenza.     If your COVID-19 test is positive, you will be contacted by phone with further recommendations if any.  Because you do not have a history of being immune compromised, you are currently vaccinated for COVID-19, you are under the age of 45, you do not have a risk of severe disease due to COVID-19, antiviral treatment is not indicated.   Your strep test today is negative.  Streptococcal throat culture will be performed per our protocol.  The result of your throat culture will be posted to your MyChart once it is complete, this typically takes 3 to 5 days.     If your streptococcal throat culture is positive, you will be contacted by phone and antibiotics will be prescribed for you.     Please see the list below for recommended medications, dosages and frequencies to provide relief of your current symptoms:    Advil, Motrin (ibuprofen): This is a good anti-inflammatory medication which addresses aches, pains and inflammation of the upper airways that causes sinus and nasal congestion as well as in the lower airways which makes your cough feel tight and sometimes burn.  I recommend that you take between 400 to 600 mg every 6-8 hours  as needed.      Tylenol (acetaminophen): This is a good fever reducer.  If your body temperature rises above 101.5 as measured with a thermometer, it is recommended that you take 1,000 mg every 8 hours until your temperature falls below 101.5, please not take more than 3,000 mg of acetaminophen either as a separate medication or as in ingredient in an over-the-counter cold/flu preparation within a 24-hour period.      Robitussin, Mucinex (guaifenesin): This is an expectorant.  This helps break up chest congestion and loosen up thick nasal drainage making phlegm and drainage more liquid and therefore easier to remove.  I recommend being 400 mg three times daily as needed.      Please follow-up within the next 5-7 days either with your primary care provider or urgent care if your symptoms do not resolve.  If you do not have a primary care provider, we will assist you in finding one.   Thank you for visiting urgent care today.  We appreciate the opportunity to participate in your care.  On a side note, I see in your medical records that you used to see Dr. Alvy Bimler at [place].  I am aware that this office closed but I wanted to let you know that Dr. Alvy Bimler is now practicing at [facility] health if you are interested in returning to see him.

## 2021-08-24 ENCOUNTER — Encounter: Payer: Self-pay | Admitting: Family Medicine

## 2021-08-24 ENCOUNTER — Ambulatory Visit (INDEPENDENT_AMBULATORY_CARE_PROVIDER_SITE_OTHER): Payer: No Typology Code available for payment source | Admitting: Family Medicine

## 2021-08-24 ENCOUNTER — Other Ambulatory Visit: Payer: Self-pay

## 2021-08-24 ENCOUNTER — Other Ambulatory Visit (HOSPITAL_BASED_OUTPATIENT_CLINIC_OR_DEPARTMENT_OTHER): Payer: Self-pay

## 2021-08-24 VITALS — BP 116/80 | HR 90 | Temp 97.8°F | Resp 16 | Ht 74.0 in | Wt 170.2 lb

## 2021-08-24 DIAGNOSIS — F909 Attention-deficit hyperactivity disorder, unspecified type: Secondary | ICD-10-CM | POA: Diagnosis not present

## 2021-08-24 MED ORDER — AMPHETAMINE-DEXTROAMPHETAMINE 20 MG PO TABS: 20.0000 mg | ORAL_TABLET | Freq: Two times a day (BID) | ORAL | 0 refills | Status: DC

## 2021-08-24 NOTE — Progress Notes (Signed)
   Subjective:    Patient ID: Clayton Avery, male    DOB: May 10, 2000, 21 y.o.   MRN: [id number]  HPI ADHD- having a difficult time keeping up w/ readings, completing work.  Previously on Adderall 20mg  daily.  Did not take meds on weekends or days w/o class.  Noticed that on previous medication it would wear off sooner than needed.  Was previously on extended release when he was younger and this was really hard on his appetite.  Currently a junior at .  Studying [employer].   Review of Systems For ROS see HPI     Objective:   Physical Exam Vitals reviewed.  Constitutional:      General: He is not in acute distress.    Appearance: Normal appearance. He is not ill-appearing.  HENT:     Head: Normocephalic and atraumatic.  Cardiovascular:     Rate and Rhythm: Normal rate and regular rhythm.  Pulmonary:     Effort: Pulmonary effort is normal. No respiratory distress.  Skin:    General: Skin is warm and dry.  Neurological:     General: No focal deficit present.     Mental Status: He is alert and oriented to person, place, and time.  Psychiatric:        Mood and Affect: Mood normal.        Behavior: Behavior normal.        Thought Content: Thought content normal.           Assessment & Plan:   ADHD- ongoing issue for pt.  New to provider.  Pt admits that the short acting Adderall is wearing off too quickly but he is fearful of switching to extended release b/c when he was on that in the past, he had a serious problem w/ appetite and weight.  Will do twice daily short acting Adderall 20mg  daily and monitor for side effects such as weight loss and for symptom improvement.  Pt expressed understanding and is in agreement w/ plan.

## 2021-08-24 NOTE — Patient Instructions (Signed)
Follow up in 4-6 weeks to see how the medication is working and how school is going INCREASE the Adderall to 20mg  twice daily- you can play w/ the timing based on your schedule Make sure you are eating!!! Call with any questions or concerns GOOD LUCK THIS SEMESTER!!!

## 2021-08-25 ENCOUNTER — Other Ambulatory Visit (HOSPITAL_BASED_OUTPATIENT_CLINIC_OR_DEPARTMENT_OTHER): Payer: Self-pay

## 2021-08-25 ENCOUNTER — Other Ambulatory Visit: Payer: Self-pay | Admitting: Family Medicine

## 2021-08-25 DIAGNOSIS — F909 Attention-deficit hyperactivity disorder, unspecified type: Secondary | ICD-10-CM

## 2021-08-25 LAB — CULTURE, GROUP A STREP (THRC): Special Requests: NORMAL

## 2021-08-25 MED ORDER — AMPHETAMINE-DEXTROAMPHETAMINE 20 MG PO TABS
20.0000 mg | ORAL_TABLET | Freq: Two times a day (BID) | ORAL | 0 refills | Status: DC
  Filled 2021-08-25: qty 60, 30d supply, fill #0

## 2021-08-25 NOTE — Progress Notes (Signed)
Prescription sent to new pharmacy

## 2021-08-27 ENCOUNTER — Ambulatory Visit
Admission: RE | Admit: 2021-08-27 | Discharge: 2021-08-27 | Disposition: A | Discharge status: Home / Self Care | Payer: No Typology Code available for payment source | Source: Ambulatory Visit | Attending: Emergency Medicine | Admitting: Emergency Medicine

## 2021-08-27 VITALS — BP 116/74 | HR 92 | Temp 97.9°F | Resp 16

## 2021-08-27 DIAGNOSIS — J358 Other chronic diseases of tonsils and adenoids: Secondary | ICD-10-CM | POA: Insufficient documentation

## 2021-08-27 DIAGNOSIS — U071 COVID-19: Secondary | ICD-10-CM | POA: Insufficient documentation

## 2021-08-27 DIAGNOSIS — J029 Acute pharyngitis, unspecified: Secondary | ICD-10-CM | POA: Insufficient documentation

## 2021-08-27 LAB — POCT RAPID STREP A (OFFICE): Rapid Strep A Screen: NEGATIVE

## 2021-08-27 MED ORDER — AZITHROMYCIN 250 MG PO TABS: ORAL_TABLET | ORAL | 0 refills | Status: AC

## 2021-08-27 NOTE — ED Provider Notes (Signed)
[facility]    CSN: [id number] Arrival date & time: 08/27/21  1103    HISTORY   Chief Complaint  Patient presents with   Sore Throat    Entered by patient   Cough   HPI Clayton Avery is a pleasant, 21 y.o. male who presents to urgent care today. Patient was seen here at this urgent care by me 5 days ago where he was tested positive for COVID-19.  Patient states he began to feel better then yesterday began to have a very sore throat, pain with swallowing, worsening cough now productive of small amounts of yellow mucus and more sneezing.  Patient states he has been salt water gargles and honey without meaningful relief.  Rapid strep test today was negative.  The history is provided by the patient.   Past Medical History:  Diagnosis Date   ADHD (attention deficit hyperactivity disorder)    ADHD (attention deficit hyperactivity disorder)    Asthma    Migraine    Otitis    Tethered cord [facility])    Patient Active Problem List   Diagnosis Date Noted   Ulnar neuropathy at elbow, left 06/29/2021   Alcohol use 10/26/2020   Generalized anxiety disorder 10/26/2020   Mood disorder (HCC) 10/26/2020   Finger injury, right, initial encounter 02/21/2016   Past Surgical History:  Procedure Laterality Date   BACK SURGERY     LUMBAR LAMINECTOMY FOR TETHERED CORD RELEASE  2003   spinal cord surgery     tubes in ears      Home Medications    Prior to Admission medications   Medication Sig Start Date End Date Taking? Authorizing Provider  amphetamine-dextroamphetamine (ADDERALL) 20 MG tablet Take 1 tablet (20 mg total) by mouth 2 (two) times daily. 08/25/21   Sheliah Hatch, MD    Family History History reviewed. No pertinent family history. Social History Social History   Tobacco Use   Smoking status: Never   Smokeless tobacco: Never  Vaping Use   Vaping Use: Never used  Substance Use Topics   Alcohol use: Yes    Comment: 3 x weekly   Drug use: Not  Currently    Types: Marijuana    Comment: occasional   Allergies   Patient has no known allergies.  Review of Systems Review of Systems Pertinent findings revealed after performing a 14 point review of systems has been noted in the history of present illness.  Physical Exam Triage Vital Signs ED Triage Vitals  Enc Vitals Group     BP 10/28/20 0827 (!) 147/82     Pulse Rate 10/28/20 0827 72     Resp 10/28/20 0827 18     Temp 10/28/20 0827 98.3 F (36.8 C)     Temp Source 10/28/20 0827 Oral     SpO2 10/28/20 0827 98 %     Weight --      Height --      Head Circumference --      Peak Flow --      Pain Score 10/28/20 0826 5     Pain Loc --      Pain Edu? --      Excl. in GC? --   No data found.  Updated Vital Signs BP 116/74 (BP Location: Right Arm)   Pulse 92   Temp 97.9 F (36.6 C) (Oral)   Resp 16   SpO2 98%   Physical Exam Vitals and nursing note reviewed.  Constitutional:  General: He is not in acute distress.    Appearance: Normal appearance. He is well-developed. He is ill-appearing. He is not toxic-appearing.  HENT:     Head: Normocephalic and atraumatic.     Salivary Glands: Right salivary gland is diffusely enlarged and tender. Left salivary gland is diffusely enlarged and tender.     Right Ear: Hearing, tympanic membrane, ear canal and external ear normal. No drainage. No middle ear effusion. There is no impacted cerumen. Tympanic membrane is not erythematous or bulging.     Left Ear: Hearing, tympanic membrane, ear canal and external ear normal. No drainage.  No middle ear effusion. There is no impacted cerumen. Tympanic membrane is not erythematous or bulging.     Ears:     Comments: Bilateral EACs with mild erythema, bilateral TMs are normal    Nose: Nose normal. No nasal deformity, septal deviation, mucosal edema, congestion or rhinorrhea.     Right Turbinates: Not enlarged, swollen or pale.     Left Turbinates: Not enlarged, swollen or pale.      Right Sinus: No maxillary sinus tenderness or frontal sinus tenderness.     Left Sinus: No maxillary sinus tenderness or frontal sinus tenderness.     Mouth/Throat:     Lips: Pink. No lesions.     Mouth: Mucous membranes are moist. No oral lesions or angioedema.     Dentition: No gingival swelling.     Tongue: No lesions.     Palate: No mass.     Pharynx: Uvula midline. Pharyngeal swelling, oropharyngeal exudate and posterior oropharyngeal erythema present. No uvula swelling.     Tonsils: Tonsillar exudate present. 2+ on the right. 2+ on the left.  Eyes:     General: Lids are normal.        Right eye: No discharge.        Left eye: No discharge.     Extraocular Movements: Extraocular movements intact.     Conjunctiva/sclera: Conjunctivae normal.     Right eye: Right conjunctiva is not injected.     Left eye: Left conjunctiva is not injected.     Pupils: Pupils are equal, round, and reactive to light.  Neck:     Thyroid: No thyroid mass, thyromegaly or thyroid tenderness.     Trachea: Phonation normal. Tracheal tenderness present. No abnormal tracheal secretions or tracheal deviation.     Comments: Voice is muffled Cardiovascular:     Rate and Rhythm: Normal rate and regular rhythm.     Pulses: Normal pulses.     Heart sounds: Normal heart sounds, S1 normal and S2 normal. No murmur heard.    No friction rub. No gallop.  Pulmonary:     Effort: Pulmonary effort is normal. No accessory muscle usage, prolonged expiration, respiratory distress or retractions.     Breath sounds: Normal breath sounds. No stridor, decreased air movement or transmitted upper airway sounds. No decreased breath sounds, wheezing, rhonchi or rales.  Chest:     Chest wall: No tenderness.  Abdominal:     General: Bowel sounds are normal.     Palpations: Abdomen is soft.     Tenderness: There is no abdominal tenderness. There is no right CVA tenderness, left CVA tenderness or rebound. Negative signs include  Murphy's sign.     Hernia: No hernia is present.  Musculoskeletal:        General: No tenderness. Normal range of motion.     Cervical back: Full passive range of motion without  pain, normal range of motion and neck supple. Normal range of motion.     Right lower leg: No edema.     Left lower leg: No edema.  Lymphadenopathy:     Cervical: Cervical adenopathy present.     Right cervical: Superficial cervical adenopathy present.     Left cervical: Superficial cervical adenopathy present.  Skin:    General: Skin is warm and dry.     Findings: No erythema, lesion or rash.  Neurological:     General: No focal deficit present.     Mental Status: He is alert and oriented to person, place, and time. Mental status is at baseline.  Psychiatric:        Mood and Affect: Mood normal.        Behavior: Behavior normal.        Thought Content: Thought content normal.        Judgment: Judgment normal.     Visual Acuity Right Eye Distance:   Left Eye Distance:   Bilateral Distance:    Right Eye Near:   Left Eye Near:    Bilateral Near:     UC Couse / Diagnostics / Procedures:     Radiology No results found.  Procedures Procedures (including critical care time) EKG  Pending results:  Labs Reviewed  CULTURE, GROUP A STREP [facility])  POCT RAPID STREP A (OFFICE)    Medications Ordered in UC: Medications - No data to display  UC Diagnoses / Final Clinical Impressions(s)   I have reviewed the triage vital signs and the nursing notes.  Pertinent labs & imaging results that were available during my care of the patient were reviewed by me and considered in my medical decision making (see chart for details).    Final diagnoses:  Acute pharyngitis, unspecified etiology  COVID-19  Tonsillar exudate   Rapid strep test today was negative.  Throat culture is pending.  Given exudate on tonsils which were not present at last visit, it is difficult for me to say whether patient is having  COVID relapse or whether he has picked up a bacterial infection in his throat.  We will treat patient empirically with azithromycin for presumed bacterial infection.  Patient advised of possibility of COVID-19 relapse if azithromycin does not improve his symptoms.  Patient advised continue ibuprofen and remedies for relief of symptoms.  Note provided for work.  Return precautions advised.  ED Prescriptions     Medication Sig Dispense Auth. Provider   azithromycin (ZITHROMAX) 250 MG tablet Take 2 tablets (500 mg total) by mouth daily for 1 day, THEN 1 tablet (250 mg total) daily for 4 days. 6 tablet Theadora Rama Scales, PA-C      PDMP not reviewed this encounter.  Disposition Upon Discharge:  Condition: stable for discharge home Home: take medications as prescribed; routine discharge instructions as discussed; follow up as advised.  Patient presented with an acute illness with associated systemic symptoms and significant discomfort requiring urgent management. In my opinion, this is a condition that a prudent lay person (someone who possesses an average knowledge of health and medicine) may potentially expect to result in complications if not addressed urgently such as respiratory distress, impairment of bodily function or dysfunction of bodily organs.   Routine symptom specific, illness specific and/or disease specific instructions were discussed with the patient and/or caregiver at length.   As such, the patient has been evaluated and assessed, work-up was performed and treatment was provided in alignment with urgent care  protocols and evidence based medicine.  Patient/parent/caregiver has been advised that the patient may require follow up for further testing and treatment if the symptoms continue in spite of treatment, as clinically indicated and appropriate.  If the patient was tested for COVID-19, Influenza and/or RSV, then the patient/parent/guardian was advised to isolate at home  pending the results of his/her diagnostic coronavirus test and potentially longer if they're positive. I have also advised pt that if his/her COVID-19 test returns positive, it's recommended to self-isolate for at least 10 days after symptoms first appeared AND until fever-free for 24 hours without fever reducer AND other symptoms have improved or resolved. Discussed self-isolation recommendations as well as instructions for household member/close contacts as per the [facility] and [place] DHHS, and also gave patient the COVID packet with this information.  Patient/parent/caregiver has been advised to return to the [facility] or PCP in 3-5 days if no better; to PCP or the Emergency Department if new signs and symptoms develop, or if the current signs or symptoms continue to change or worsen for further workup, evaluation and treatment as clinically indicated and appropriate  The patient will follow up with their current PCP if and as advised. If the patient does not currently have a PCP we will assist them in obtaining one.   The patient may need specialty follow up if the symptoms continue, in spite of conservative treatment and management, for further workup, evaluation, consultation and treatment as clinically indicated and appropriate.  Patient/parent/caregiver verbalized understanding and agreement of plan as discussed.  All questions were addressed during visit.  Please see discharge instructions below for further details of plan.  Discharge Instructions:   Discharge Instructions      Your rapid strep test today was again negative.  Because of the duration of your symptoms, I do believe that you would benefit from antibiotics.    I have sent a prescription for azithromycin to your pharmacy.  Please take 2 tablets when you pick up the prescription and 1 tablet daily thereafter.  I have provided you with a note to be out of work for the next 3 days.    Please continue taking ibuprofen 3 times daily as needed  for pain and sore throat.  Warm salt water gargles are also recommended along with hot tea, warm chicken broth and popsicles.    I hope you feel better soon.      This office note has been dictated using Teaching laboratory technician.  Unfortunately, this method of dictation can sometimes lead to typographical or grammatical errors.  I apologize for your inconvenience in advance if this occurs.  Please do not hesitate to reach out to me if clarification is needed.      Theadora Rama Scales, [place] 08/27/21 [phone]

## 2021-08-27 NOTE — ED Triage Notes (Signed)
The patient states he has sore throat, cough and sneezing.   Home interventions: salt water gargles, honey.

## 2021-08-27 NOTE — Discharge Instructions (Signed)
Your rapid strep test today was again negative.  Because of the duration of your symptoms, I do believe that you would benefit from antibiotics.    I have sent a prescription for azithromycin to your pharmacy.  Please take 2 tablets when you pick up the prescription and 1 tablet daily thereafter.  I have provided you with a note to be out of work for the next 3 days.    Please continue taking ibuprofen 3 times daily as needed for pain and sore throat.  Warm salt water gargles are also recommended along with hot tea, warm chicken broth and popsicles.    I hope you feel better soon.

## 2021-08-31 LAB — CULTURE, GROUP A STREP (THRC)

## 2021-09-23 ENCOUNTER — Other Ambulatory Visit: Payer: Self-pay | Admitting: Family Medicine

## 2021-09-23 DIAGNOSIS — F909 Attention-deficit hyperactivity disorder, unspecified type: Secondary | ICD-10-CM

## 2021-09-25 NOTE — Telephone Encounter (Signed)
Patient is requesting a refill of the following medications: Requested Prescriptions   Pending Prescriptions Disp Refills   amphetamine-dextroamphetamine (ADDERALL) 20 MG tablet 60 tablet 0    Sig: Take 1 tablet (20 mg total) by mouth 2 (two) times daily.    Date of patient request: 09/25/21 Last office visit: 08/24/21 Date of last refill: 08/25/21 Last refill amount: 60 Follow up time period per chart: 4-6 weeks, 10/06/21 scheduled

## 2021-09-26 ENCOUNTER — Other Ambulatory Visit (HOSPITAL_BASED_OUTPATIENT_CLINIC_OR_DEPARTMENT_OTHER): Payer: Self-pay

## 2021-09-26 MED ORDER — AMPHETAMINE-DEXTROAMPHETAMINE 20 MG PO TABS
20.0000 mg | ORAL_TABLET | Freq: Two times a day (BID) | ORAL | 0 refills | Status: DC
  Filled 2021-09-26: qty 60, 30d supply, fill #0

## 2021-10-06 ENCOUNTER — Ambulatory Visit: Payer: No Typology Code available for payment source | Admitting: Family Medicine

## 2021-10-23 ENCOUNTER — Ambulatory Visit
Admission: EM | Admit: 2021-10-23 | Discharge: 2021-10-23 | Disposition: A | Discharge status: Home / Self Care | Payer: No Typology Code available for payment source | Attending: Urgent Care | Admitting: Urgent Care

## 2021-10-23 DIAGNOSIS — A749 Chlamydial infection, unspecified: Secondary | ICD-10-CM | POA: Diagnosis present

## 2021-10-23 DIAGNOSIS — Z202 Contact with and (suspected) exposure to infections with a predominantly sexual mode of transmission: Secondary | ICD-10-CM | POA: Insufficient documentation

## 2021-10-23 MED ORDER — DOXYCYCLINE HYCLATE 100 MG PO CAPS: 100.0000 mg | ORAL_CAPSULE | Freq: Two times a day (BID) | ORAL | 0 refills | Status: DC

## 2021-10-23 NOTE — Discharge Instructions (Signed)
Avoid all forms of sexual intercourse (oral, vaginal, anal) for the next 7 days to avoid spreading/reinfecting or at least until we can see what kinds of infection results are positive.  Abstaining for 2 weeks would be better but at least 1 week is required.  We will let you know about your test results from the swab we did today and if you need any prescriptions for antibiotics or changes to your treatment from today.  

## 2021-10-23 NOTE — ED Triage Notes (Signed)
The patient states he has been expose to chlamydia. Pt denies any symptoms.

## 2021-10-23 NOTE — ED Provider Notes (Signed)
  [facility]  Note:  This document was prepared using Systems analyst and may include unintentional dictation errors.  MRN: [id number] DOB: 2000/12/28  Subjective:   Clayton Avery is a 21 y.o. male presenting for exposure to chlamydia. Had unprotected sex with someone that tested positive. Denies dysuria, hematuria, urinary frequency, penile discharge, penile swelling, testicular pain, testicular swelling, anal pain, groin pain.   No current facility-administered medications for this encounter.  Current Outpatient Medications:    amphetamine-dextroamphetamine (ADDERALL) 20 MG tablet, Take 1 tablet (20 mg total) by mouth 2 (two) times daily., Disp: 60 tablet, Rfl: 0   No Known Allergies  Past Medical History:  Diagnosis Date   ADHD (attention deficit hyperactivity disorder)    ADHD (attention deficit hyperactivity disorder)    Asthma    Migraine    Otitis    Tethered cord ([facility])      Past Surgical History:  Procedure Laterality Date   BACK SURGERY     LUMBAR LAMINECTOMY FOR TETHERED CORD RELEASE  2003   spinal cord surgery     tubes in ears      No family history on file.  Social History   Tobacco Use   Smoking status: Never   Smokeless tobacco: Never  Vaping Use   Vaping Use: Never used  Substance Use Topics   Alcohol use: Yes    Comment: 3 x weekly   Drug use: Not Currently    Types: Marijuana    Comment: occasional    ROS   Objective:   Vitals: BP 132/68 (BP Location: Left Arm)   Pulse 71   Temp 97.9 F (36.6 C) (Oral)   Resp 16   SpO2 100%   Physical Exam Constitutional:      General: He is not in acute distress.    Appearance: Normal appearance. He is well-developed and normal weight. He is not ill-appearing, toxic-appearing or diaphoretic.  HENT:     Head: Normocephalic and atraumatic.     Right Ear: External ear normal.     Left Ear: External ear normal.     Nose: Nose normal.      Mouth/Throat:     Pharynx: Oropharynx is clear.  Eyes:     General: No scleral icterus.       Right eye: No discharge.        Left eye: No discharge.     Extraocular Movements: Extraocular movements intact.  Cardiovascular:     Rate and Rhythm: Normal rate.  Pulmonary:     Effort: Pulmonary effort is normal.  Musculoskeletal:     Cervical back: Normal range of motion.  Neurological:     Mental Status: He is alert and oriented to person, place, and time.  Psychiatric:        Mood and Affect: Mood normal.        Behavior: Behavior normal.        Thought Content: Thought content normal.        Judgment: Judgment normal.     Assessment and Plan :   PDMP not reviewed this encounter.  1. Chlamydia infection   2. Exposure to sexually transmitted disease (STD)     Cytology pending. Will treat empirically given exposure. Counseled patient on potential for adverse effects with medications prescribed/recommended today, ER and return-to-clinic precautions discussed, patient verbalized understanding.    Jaynee Eagles, PA-C 10/24/21 0630

## 2021-10-24 LAB — CYTOLOGY, (ORAL, ANAL, URETHRAL) ANCILLARY ONLY
Chlamydia: NEGATIVE
Comment: NEGATIVE
Comment: NEGATIVE
Comment: NORMAL
Neisseria Gonorrhea: NEGATIVE
Trichomonas: NEGATIVE

## 2021-10-30 ENCOUNTER — Other Ambulatory Visit: Payer: Self-pay | Admitting: Family Medicine

## 2021-10-30 ENCOUNTER — Other Ambulatory Visit (HOSPITAL_BASED_OUTPATIENT_CLINIC_OR_DEPARTMENT_OTHER): Payer: Self-pay

## 2021-10-30 DIAGNOSIS — F909 Attention-deficit hyperactivity disorder, unspecified type: Secondary | ICD-10-CM

## 2021-10-30 MED ORDER — AMPHETAMINE-DEXTROAMPHETAMINE 20 MG PO TABS
20.0000 mg | ORAL_TABLET | Freq: Two times a day (BID) | ORAL | 0 refills | Status: DC
  Filled 2021-10-30 – 2021-10-31 (×2): qty 60, 30d supply, fill #0

## 2021-10-30 NOTE — Telephone Encounter (Signed)
Adderall 20 mg LOV: 08/24/21 Last Refill 09/26/21 Upcoming appt: none  I have spoken to the pt and advised he will need to est care with a new provider  He is reaching out to [facility] to try to est

## 2021-10-31 ENCOUNTER — Encounter (HOSPITAL_BASED_OUTPATIENT_CLINIC_OR_DEPARTMENT_OTHER): Payer: Self-pay | Admitting: Pharmacist

## 2021-10-31 ENCOUNTER — Other Ambulatory Visit (HOSPITAL_BASED_OUTPATIENT_CLINIC_OR_DEPARTMENT_OTHER): Payer: Self-pay

## 2021-11-02 ENCOUNTER — Other Ambulatory Visit (HOSPITAL_BASED_OUTPATIENT_CLINIC_OR_DEPARTMENT_OTHER): Payer: Self-pay

## 2021-11-27 ENCOUNTER — Other Ambulatory Visit: Payer: Self-pay

## 2021-11-27 ENCOUNTER — Emergency Department (HOSPITAL_BASED_OUTPATIENT_CLINIC_OR_DEPARTMENT_OTHER)
Admission: EM | Admit: 2021-11-27 | Discharge: 2021-11-27 | Disposition: A | Discharge status: Home / Self Care | Payer: No Typology Code available for payment source | Attending: Emergency Medicine | Admitting: Emergency Medicine

## 2021-11-27 ENCOUNTER — Encounter (HOSPITAL_BASED_OUTPATIENT_CLINIC_OR_DEPARTMENT_OTHER): Payer: Self-pay | Admitting: *Deleted

## 2021-11-27 ENCOUNTER — Other Ambulatory Visit (HOSPITAL_BASED_OUTPATIENT_CLINIC_OR_DEPARTMENT_OTHER): Payer: Self-pay

## 2021-11-27 DIAGNOSIS — J45909 Unspecified asthma, uncomplicated: Secondary | ICD-10-CM | POA: Insufficient documentation

## 2021-11-27 DIAGNOSIS — M5442 Lumbago with sciatica, left side: Secondary | ICD-10-CM | POA: Diagnosis not present

## 2021-11-27 DIAGNOSIS — M545 Low back pain, unspecified: Secondary | ICD-10-CM | POA: Diagnosis present

## 2021-11-27 MED ORDER — IBUPROFEN 600 MG PO TABS
600.0000 mg | ORAL_TABLET | Freq: Four times a day (QID) | ORAL | 0 refills | Status: DC | PRN
  Filled 2021-11-27: qty 30, 8d supply, fill #0

## 2021-11-27 MED ORDER — CYCLOBENZAPRINE HCL 5 MG PO TABS
10.0000 mg | ORAL_TABLET | Freq: Two times a day (BID) | ORAL | 0 refills | Status: DC | PRN
  Filled 2021-11-27: qty 10, 3d supply, fill #0

## 2021-11-27 MED ORDER — KETOROLAC TROMETHAMINE 30 MG/ML IJ SOLN
30.0000 mg | Freq: Once | INTRAMUSCULAR | Status: AC
  Administered 2021-11-27: 30 mg via INTRAMUSCULAR
  Filled 2021-11-27: qty 1

## 2021-11-27 NOTE — ED Provider Notes (Signed)
[facility] EMERGENCY DEPT Provider Note   CSN: [id number] Arrival date & time: 11/27/21  0252     History  Chief Complaint  Patient presents with   Back Pain    Clayton Avery is a 21 y.o. male.  HPI     This is a 21 year old male who presents with back pain.  Patient reports that he was lifting a heavy weight earlier yesterday when he had acute onset of back pain.  Since that time he has had progressively worsening lower back pain that radiates into the left leg.  He states it is worse when he lifts his left leg.  He was unable to sleep tonight.  He describes pain as sharp and shooting.  Denies bowel or bladder difficulty.  Denies weakness.  He has not taken anything for the pain.  Home Medications Prior to Admission medications   Medication Sig Start Date End Date Taking? Authorizing Provider  amphetamine-dextroamphetamine (ADDERALL) 20 MG tablet Take 1 tablet (20 mg total) by mouth 2 (two) times daily. 10/30/21   Eulis Foster, FNP      Allergies    Patient has no known allergies.    Review of Systems   Review of Systems  Constitutional:  Negative for fever.  Musculoskeletal:  Positive for back pain.  Neurological:  Negative for weakness.  All other systems reviewed and are negative.   Physical Exam Updated Vital Signs BP (!) 166/84 (BP Location: Left Arm)   Pulse 68   Temp 98.3 F (36.8 C) (Oral)   Resp 18   Wt 81.6 kg   SpO2 100%   BMI 23.11 kg/m  Physical Exam Vitals and nursing note reviewed.  Constitutional:      Appearance: He is well-developed. He is not ill-appearing.  HENT:     Head: Normocephalic and atraumatic.  Eyes:     Pupils: Pupils are equal, round, and reactive to light.  Cardiovascular:     Rate and Rhythm: Normal rate and regular rhythm.     Heart sounds: Normal heart sounds.  Pulmonary:     Effort: Pulmonary effort is normal. No respiratory distress.     Breath sounds: Normal breath sounds. No wheezing.   Abdominal:     Palpations: Abdomen is soft.     Tenderness: There is no abdominal tenderness.  Musculoskeletal:     Cervical back: Neck supple.     Comments: Tenderness palpation lower lumbar spine, no step-off or deformity noted, positive left straight leg raise  Lymphadenopathy:     Cervical: No cervical adenopathy.  Skin:    General: Skin is warm and dry.  Neurological:     Mental Status: He is alert and oriented to person, place, and time.     Comments: 5 out of 5 strength bilateral lower extremities, normal gait  Psychiatric:        Mood and Affect: Mood normal.     ED Results / Procedures / Treatments   Labs (all labs ordered are listed, but only abnormal results are displayed) Labs Reviewed - No data to display  EKG None  Radiology No results found.  Procedures Procedures    Medications Ordered in ED Medications  ketorolac (TORADOL) 30 MG/ML injection 30 mg (30 mg Intramuscular Given 11/27/21 0305)    ED Course/ Medical Decision Making/ A&P                           Medical Decision Making Risk  Prescription drug management.   This patient presents to the ED for concern of back pain, this involves an extensive number of treatment options, and is a complaint that carries with it a high risk of complications and morbidity.  I considered the following differential and admission for this acute, potentially life threatening condition.  The differential diagnosis includes acute strain, disc protrusion, less likely fracture  MDM:    This is a 21 year old male who presents with back pain.  Reports injury earlier today after lifting a heavy weight.  He is nontoxic and vital signs are reassuring.  He is neurologically intact.  No red flags.  No signs or symptoms of cauda equina.  He has a positive left straight leg raise.  May have a bulging disc versus musculoskeletal strain.  Regardless, treatment would be the same.  He was given a dose of IM Toradol.  Will discharge  with ibuprofen and muscle relaxant.  (Labs, imaging, consults)  Labs: I Ordered, and personally interpreted labs.  The pertinent results include: None  Imaging Studies ordered: I ordered imaging studies including none I independently visualized and interpreted imaging. I agree with the radiologist interpretation  Additional history obtained from chart review.  External records from outside source obtained and reviewed including prior evaluations  Cardiac Monitoring: The patient was maintained on a cardiac monitor.  I personally viewed and interpreted the cardiac monitored which showed an underlying rhythm of: Sinus rhythm  Reevaluation: After the interventions noted above, I reevaluated the patient and found that they have :stayed the same  Social Determinants of Health:  lives independently  Disposition: Discharge  Co morbidities that complicate the patient evaluation  Past Medical History:  Diagnosis Date   ADHD (attention deficit hyperactivity disorder)    ADHD (attention deficit hyperactivity disorder)    Asthma    Migraine    Otitis    Tethered cord (HCC)      Medicines Meds ordered this encounter  Medications   ketorolac (TORADOL) 30 MG/ML injection 30 mg    I have reviewed the patients home medicines and have made adjustments as needed  Problem List / ED Course: Problem List Items Addressed This Visit   None Visit Diagnoses     Acute left-sided low back pain with left-sided sciatica    -  Primary   Relevant Medications   ketorolac (TORADOL) 30 MG/ML injection 30 mg (Completed)                   Final Clinical Impression(s) / ED Diagnoses Final diagnoses:  Acute left-sided low back pain with left-sided sciatica    Rx / DC Orders ED Discharge Orders     None         Shon Baton, MD 11/27/21 [phone]

## 2021-11-27 NOTE — ED Triage Notes (Signed)
Pt c/o back pain for the past couple day. States yesterday he was lifting at 45 pound plate and felt pain in his lower back. States pain is radiating down his legs and with movement. Rates pain 8/10. Describes as sharp/shooting type pain. Has not taken anything for pain. Pt drove himself.

## 2021-11-27 NOTE — Discharge Instructions (Signed)
You were seen today for back pain.  Your history and exam is consistent with sciatica.  Given your remote history of tethered cord, follow-up closely with sports medicine.  If symptoms are improving, you may need an MRI.

## 2021-11-28 ENCOUNTER — Encounter: Payer: Self-pay | Admitting: Family Medicine

## 2021-11-28 ENCOUNTER — Ambulatory Visit (INDEPENDENT_AMBULATORY_CARE_PROVIDER_SITE_OTHER): Payer: No Typology Code available for payment source | Admitting: Family Medicine

## 2021-11-28 ENCOUNTER — Other Ambulatory Visit (HOSPITAL_BASED_OUTPATIENT_CLINIC_OR_DEPARTMENT_OTHER): Payer: Self-pay

## 2021-11-28 VITALS — BP 112/60 | Ht 74.0 in | Wt 180.0 lb

## 2021-11-28 DIAGNOSIS — M5416 Radiculopathy, lumbar region: Secondary | ICD-10-CM | POA: Diagnosis not present

## 2021-11-28 MED ORDER — HYDROCODONE-ACETAMINOPHEN 5-325 MG PO TABS
1.0000 | ORAL_TABLET | Freq: Three times a day (TID) | ORAL | 0 refills | Status: DC | PRN
  Filled 2021-11-28: qty 15, 5d supply, fill #0

## 2021-11-28 NOTE — Progress Notes (Signed)
  Clayton Avery - 21 y.o. male MRN [id number]  Date of birth: [birth year]  SUBJECTIVE:  Including CC & ROS.  No chief complaint on file.   Clayton Avery is a 21 y.o. male that is presenting with acute back pain and left leg pain.  He was performing a dead lift and felt an acute pain.  Has progressively gotten worse.  Has trouble flexing or extending the back.  No history of surgery.  Review of the emergency department note from 11/27 shows he was provided Toradol   Review of Systems See HPI   HISTORY: Past Medical, Surgical, Social, and Family History Reviewed & Updated per EMR.   Pertinent Historical Findings include:  Past Medical History:  Diagnosis Date   ADHD (attention deficit hyperactivity disorder)    ADHD (attention deficit hyperactivity disorder)    Asthma    Migraine    Otitis    Tethered cord (HCC)     Past Surgical History:  Procedure Laterality Date   BACK SURGERY     LUMBAR LAMINECTOMY FOR TETHERED CORD RELEASE  2003   spinal cord surgery     tubes in ears       PHYSICAL EXAM:  VS: BP 112/60 (BP Location: Left Arm, Patient Position: Sitting)   Ht 6\' 2"  (1.88 m)   Wt 180 lb (81.6 kg)   BMI 23.11 kg/m  Physical Exam Gen: NAD, alert, cooperative with exam, well-appearing MSK:  Neurovascularly intact       ASSESSMENT & PLAN:   Lumbar radiculopathy Acutely occurring after performing a dead lift.  Has significant irritation with any motion. -Counseled on home exercise therapy and supportive care -Counseled on ibuprofen -Norco. -Could consider imaging or physical therapy

## 2021-11-28 NOTE — Patient Instructions (Signed)
Good to see you Please try heat  Please try the stretches  Please use ibuprofen 600 mg three to four times daily  Please use the pain medicine as needed  Please send me a message in MyChart with any questions or updates.  Please see me back in 2-3 weeks.   --Dr. Jordan Likes

## 2021-11-28 NOTE — Assessment & Plan Note (Signed)
Acutely occurring after performing a dead lift.  Has significant irritation with any motion. -Counseled on home exercise therapy and supportive care -Counseled on ibuprofen -Norco. -Could consider imaging or physical therapy

## 2021-11-28 NOTE — Addendum Note (Signed)
Addended by: Myra Rude on: 11/28/2021 02:55 PM   Modules accepted: Orders

## 2021-12-01 ENCOUNTER — Encounter: Payer: Self-pay | Admitting: Family Medicine

## 2021-12-04 ENCOUNTER — Other Ambulatory Visit: Payer: Self-pay | Admitting: Family

## 2021-12-04 DIAGNOSIS — F909 Attention-deficit hyperactivity disorder, unspecified type: Secondary | ICD-10-CM

## 2021-12-09 ENCOUNTER — Other Ambulatory Visit (HOSPITAL_BASED_OUTPATIENT_CLINIC_OR_DEPARTMENT_OTHER): Payer: Self-pay

## 2021-12-09 ENCOUNTER — Other Ambulatory Visit: Payer: Self-pay | Admitting: Family

## 2021-12-09 DIAGNOSIS — F909 Attention-deficit hyperactivity disorder, unspecified type: Secondary | ICD-10-CM

## 2021-12-12 ENCOUNTER — Other Ambulatory Visit (HOSPITAL_BASED_OUTPATIENT_CLINIC_OR_DEPARTMENT_OTHER): Payer: Self-pay

## 2021-12-12 ENCOUNTER — Other Ambulatory Visit: Payer: Self-pay | Admitting: Family

## 2021-12-12 DIAGNOSIS — F909 Attention-deficit hyperactivity disorder, unspecified type: Secondary | ICD-10-CM

## 2021-12-23 ENCOUNTER — Other Ambulatory Visit (HOSPITAL_BASED_OUTPATIENT_CLINIC_OR_DEPARTMENT_OTHER): Payer: Self-pay

## 2022-01-03 ENCOUNTER — Encounter (HOSPITAL_BASED_OUTPATIENT_CLINIC_OR_DEPARTMENT_OTHER): Payer: Self-pay | Admitting: Pharmacist

## 2022-01-03 ENCOUNTER — Other Ambulatory Visit (HOSPITAL_BASED_OUTPATIENT_CLINIC_OR_DEPARTMENT_OTHER): Payer: Self-pay

## 2022-01-03 ENCOUNTER — Ambulatory Visit: Payer: Self-pay | Admitting: Family Medicine

## 2022-01-09 ENCOUNTER — Ambulatory Visit (INDEPENDENT_AMBULATORY_CARE_PROVIDER_SITE_OTHER): Payer: 59 | Admitting: Family

## 2022-01-09 VITALS — BP 114/70 | HR 87 | Temp 97.8°F | Ht 74.0 in | Wt 169.4 lb

## 2022-01-09 DIAGNOSIS — R059 Cough, unspecified: Secondary | ICD-10-CM

## 2022-01-09 DIAGNOSIS — H6691 Otitis media, unspecified, right ear: Secondary | ICD-10-CM | POA: Diagnosis not present

## 2022-01-09 DIAGNOSIS — A084 Viral intestinal infection, unspecified: Secondary | ICD-10-CM | POA: Diagnosis not present

## 2022-01-09 MED ORDER — ONDANSETRON 4 MG PO TBDP: 4.0000 mg | ORAL_TABLET | Freq: Three times a day (TID) | ORAL | 0 refills | Status: DC | PRN

## 2022-01-09 MED ORDER — AMOXICILLIN 500 MG PO CAPS: 500.0000 mg | ORAL_CAPSULE | Freq: Three times a day (TID) | ORAL | 0 refills | Status: AC

## 2022-01-09 NOTE — Progress Notes (Signed)
Acute Office Visit  Subjective:     Patient ID: Clayton Avery, male    DOB: 04-27-2000, 22 y.o.   MRN: [id number]  Chief Complaint  Patient presents with  . Cough    Pt notes productive cough for 2 months, notes possible ear infection, can't hear from Rt ear no pressure,   . Vomiting    Pt notes post meal last night both his Gf and himself became ill, vomiting no diarrhea     HPI Patient is in today with c/o cough and right that has been going on for about 2 months. Cough is productive with green phlegm. His right ear has decreased hearing. No fever. Has not taken any medication.   Patient also concerned about nausea and vomiting that began last night. He and his girlfriend both have similar symptoms. He is a Consulting civil engineer at [employer] and believes it may have come from eating at a BBQ place on campus. No fever. Feels tired this am  Review of Systems  Constitutional:  Positive for malaise/fatigue. Negative for chills and fever.  HENT:  Positive for congestion, ear pain and hearing loss. Negative for ear discharge and sore throat.   Respiratory:  Positive for cough. Negative for shortness of breath and wheezing.   Cardiovascular: Negative.   Gastrointestinal:  Positive for abdominal pain, nausea and vomiting. Negative for blood in stool and diarrhea.  Genitourinary: Negative.   Musculoskeletal: Negative.   Neurological: Negative.   Psychiatric/Behavioral: Negative.    All other systems reviewed and are negative. Past Medical History:  Diagnosis Date  . ADHD (attention deficit hyperactivity disorder)   . ADHD (attention deficit hyperactivity disorder)   . Asthma   . Migraine   . Otitis   . Tethered cord [facility])     Social History   Socioeconomic History  . Marital status: Single    Spouse name: Not on file  . Number of children: Not on file  . Years of education: Not on file  . Highest education level: Not on file  Occupational History  . Not on file  Tobacco Use  .  Smoking status: Some Days    Types: Cigarettes  . Smokeless tobacco: Never  Vaping Use  . Vaping Use: Never used  Substance and Sexual Activity  . Alcohol use: Yes    Comment: occasional  . Drug use: Not Currently    Comment: occasional  . Sexual activity: Not Currently  Other Topics Concern  . Not on file  Social History Narrative   ** Merged History Encounter **       Social Determinants of Health   Financial Resource Strain: Not on file  Food Insecurity: Not on file  Transportation Needs: Not on file  Physical Activity: Not on file  Stress: Not on file  Social Connections: Not on file  Intimate Partner Violence: Not on file    Past Surgical History:  Procedure Laterality Date  . BACK SURGERY    . LUMBAR LAMINECTOMY FOR TETHERED CORD RELEASE  2003  . spinal cord surgery    . tubes in ears      No family history on file.  No Known Allergies  Current Outpatient Medications on File Prior to Visit  Medication Sig Dispense Refill  . amphetamine-dextroamphetamine (ADDERALL) 20 MG tablet Take 1 tablet (20 mg total) by mouth 2 (two) times daily. 60 tablet 0  . cyclobenzaprine (FLEXERIL) 5 MG tablet Take 2 tablets (10 mg total) by mouth 2 (two) times daily  as needed for muscle spasms. 10 tablet 0  . HYDROcodone-acetaminophen (NORCO/VICODIN) 5-325 MG tablet Take 1 tablet by mouth every 8 (eight) hours as needed. 15 tablet 0  . ibuprofen (ADVIL) 600 MG tablet Take 1 tablet (600 mg total) by mouth every 6 (six) hours as needed. 30 tablet 0   No current facility-administered medications on file prior to visit.    BP 114/70   Pulse 87   Temp 97.8 F (36.6 C) (Oral)   Ht 6\' 2"  (1.88 m)   Wt 169 lb 6.4 oz (76.8 kg)   SpO2 100%   BMI 21.75 kg/m chart      Objective:    BP 114/70   Pulse 87   Temp 97.8 F (36.6 C) (Oral)   Ht 6\' 2"  (1.88 m)   Wt 169 lb 6.4 oz (76.8 kg)   SpO2 100%   BMI 21.75 kg/m    Physical Exam Vitals and nursing note reviewed.   Constitutional:      Appearance: Normal appearance. He is normal weight.  HENT:     Right Ear: Ear canal and external ear normal.     Left Ear: Tympanic membrane, ear canal and external ear normal.     Ears:     Comments: Right bulging TM    Nose: Nose normal.  Cardiovascular:     Rate and Rhythm: Normal rate and regular rhythm.  Pulmonary:     Effort: Pulmonary effort is normal.     Breath sounds: Normal breath sounds.  Abdominal:     General: Abdomen is flat. Bowel sounds are normal.     Palpations: Abdomen is soft.     Tenderness: There is abdominal tenderness. There is no rebound.  Musculoskeletal:        General: Normal range of motion.     Cervical back: Normal range of motion and neck supple.  Skin:    General: Skin is warm and dry.  Neurological:     General: No focal deficit present.     Mental Status: He is alert and oriented to person, place, and time.  Psychiatric:        Mood and Affect: Mood normal.        Behavior: Behavior normal.   No results found for any visits on 01/09/22.      Assessment & Plan:   Problem List Items Addressed This Visit   None Visit Diagnoses     Right otitis media, unspecified otitis media type    -  Primary   Relevant Medications   amoxicillin (AMOXIL) 500 MG capsule   Viral gastroenteritis       Cough, unspecified type           Meds ordered this encounter  Medications  . amoxicillin (AMOXIL) 500 MG capsule    Sig: Take 1 capsule (500 mg total) by mouth 3 (three) times daily for 10 days.    Dispense:  30 capsule    Refill:  0  . ondansetron (ZOFRAN-ODT) 4 MG disintegrating tablet    Sig: Take 1 tablet (4 mg total) by mouth every 8 (eight) hours as needed for nausea or vomiting.    Dispense:  20 tablet    Refill:  0   Call the office if symptoms worsen or persist. Drink plenty of fluids, rest, and take medication as prescribed.  No follow-ups on file.  Kennyth Arnold, FNP

## 2022-01-12 ENCOUNTER — Other Ambulatory Visit: Payer: Self-pay | Admitting: Family

## 2022-01-12 DIAGNOSIS — F909 Attention-deficit hyperactivity disorder, unspecified type: Secondary | ICD-10-CM

## 2022-01-13 ENCOUNTER — Encounter: Payer: Self-pay | Admitting: Family Medicine

## 2022-01-15 ENCOUNTER — Other Ambulatory Visit: Payer: Self-pay | Admitting: Family

## 2022-01-15 ENCOUNTER — Other Ambulatory Visit (HOSPITAL_BASED_OUTPATIENT_CLINIC_OR_DEPARTMENT_OTHER): Payer: Self-pay

## 2022-01-15 DIAGNOSIS — F909 Attention-deficit hyperactivity disorder, unspecified type: Secondary | ICD-10-CM

## 2022-01-17 ENCOUNTER — Other Ambulatory Visit: Payer: Self-pay | Admitting: Family

## 2022-01-17 ENCOUNTER — Other Ambulatory Visit (HOSPITAL_BASED_OUTPATIENT_CLINIC_OR_DEPARTMENT_OTHER): Payer: Self-pay

## 2022-01-17 DIAGNOSIS — F909 Attention-deficit hyperactivity disorder, unspecified type: Secondary | ICD-10-CM

## 2022-01-22 ENCOUNTER — Other Ambulatory Visit (HOSPITAL_BASED_OUTPATIENT_CLINIC_OR_DEPARTMENT_OTHER): Payer: Self-pay

## 2022-02-02 ENCOUNTER — Other Ambulatory Visit (HOSPITAL_BASED_OUTPATIENT_CLINIC_OR_DEPARTMENT_OTHER): Payer: Self-pay

## 2022-02-02 ENCOUNTER — Other Ambulatory Visit: Payer: Self-pay | Admitting: Family

## 2022-02-02 DIAGNOSIS — F909 Attention-deficit hyperactivity disorder, unspecified type: Secondary | ICD-10-CM

## 2022-02-07 ENCOUNTER — Other Ambulatory Visit (HOSPITAL_BASED_OUTPATIENT_CLINIC_OR_DEPARTMENT_OTHER): Payer: Self-pay

## 2022-02-10 ENCOUNTER — Other Ambulatory Visit (HOSPITAL_BASED_OUTPATIENT_CLINIC_OR_DEPARTMENT_OTHER): Payer: Self-pay

## 2022-02-10 ENCOUNTER — Other Ambulatory Visit: Payer: Self-pay | Admitting: Registered Nurse

## 2022-02-10 DIAGNOSIS — F909 Attention-deficit hyperactivity disorder, unspecified type: Secondary | ICD-10-CM

## 2022-02-12 ENCOUNTER — Other Ambulatory Visit (HOSPITAL_BASED_OUTPATIENT_CLINIC_OR_DEPARTMENT_OTHER): Payer: Self-pay

## 2022-02-12 MED ORDER — AMPHETAMINE-DEXTROAMPHETAMINE 20 MG PO TABS
20.0000 mg | ORAL_TABLET | Freq: Two times a day (BID) | ORAL | 0 refills | Status: DC
  Filled 2022-02-12: qty 60, 30d supply, fill #0

## 2022-02-12 NOTE — Telephone Encounter (Signed)
Patient is requesting a refill of the following medications: Requested Prescriptions   Pending Prescriptions Disp Refills   amphetamine-dextroamphetamine (ADDERALL) 20 MG tablet 60 tablet 0    Sig: Take 1 tablet (20 mg total) by mouth 2 (two) times daily.    Date of patient request: 02/12/22 Last office visit: 01/09/22 Date of last refill: 10/30/21 Last refill amount: 60   Patient will be establishing soon but is requesting a refill prior to that appointment

## 2022-02-16 ENCOUNTER — Ambulatory Visit: Payer: 59 | Admitting: Family Medicine

## 2022-02-24 ENCOUNTER — Other Ambulatory Visit (HOSPITAL_BASED_OUTPATIENT_CLINIC_OR_DEPARTMENT_OTHER): Payer: Self-pay

## 2022-02-24 MED ORDER — TRETINOIN 0.025 % EX CREA
TOPICAL_CREAM | CUTANEOUS | 5 refills | Status: DC
  Filled 2022-02-24: qty 20, 30d supply, fill #0
  Filled 2022-03-21 – 2022-03-29 (×4): qty 20, 30d supply, fill #1

## 2022-02-24 MED ORDER — CLINDAMYCIN PHOSPHATE 1 % EX SWAB
CUTANEOUS | 5 refills | Status: DC
  Filled 2022-02-24: qty 30, 30d supply, fill #0
  Filled 2022-03-21: qty 30, 30d supply, fill #1
  Filled 2022-03-21: qty 60, 30d supply, fill #1
  Filled 2022-03-29: qty 30, 30d supply, fill #1

## 2022-02-24 MED ORDER — MINOCYCLINE HCL 100 MG PO CAPS
100.0000 mg | ORAL_CAPSULE | Freq: Two times a day (BID) | ORAL | 3 refills | Status: DC
  Filled 2022-02-24: qty 10, 5d supply, fill #0
  Filled 2022-02-24: qty 20, 10d supply, fill #0
  Filled 2022-03-21 – 2022-03-29 (×3): qty 30, 15d supply, fill #1

## 2022-03-21 ENCOUNTER — Other Ambulatory Visit (HOSPITAL_BASED_OUTPATIENT_CLINIC_OR_DEPARTMENT_OTHER): Payer: Self-pay

## 2022-03-21 ENCOUNTER — Other Ambulatory Visit: Payer: Self-pay | Admitting: Family

## 2022-03-21 ENCOUNTER — Other Ambulatory Visit: Payer: Self-pay

## 2022-03-21 DIAGNOSIS — F909 Attention-deficit hyperactivity disorder, unspecified type: Secondary | ICD-10-CM

## 2022-03-26 ENCOUNTER — Other Ambulatory Visit (HOSPITAL_BASED_OUTPATIENT_CLINIC_OR_DEPARTMENT_OTHER): Payer: Self-pay

## 2022-03-29 ENCOUNTER — Other Ambulatory Visit (HOSPITAL_BASED_OUTPATIENT_CLINIC_OR_DEPARTMENT_OTHER): Payer: Self-pay

## 2022-04-05 ENCOUNTER — Other Ambulatory Visit (HOSPITAL_BASED_OUTPATIENT_CLINIC_OR_DEPARTMENT_OTHER): Payer: Self-pay

## 2022-04-08 ENCOUNTER — Other Ambulatory Visit (HOSPITAL_BASED_OUTPATIENT_CLINIC_OR_DEPARTMENT_OTHER): Payer: Self-pay

## 2022-04-08 ENCOUNTER — Other Ambulatory Visit: Payer: Self-pay | Admitting: Family

## 2022-04-08 DIAGNOSIS — F909 Attention-deficit hyperactivity disorder, unspecified type: Secondary | ICD-10-CM

## 2022-04-16 ENCOUNTER — Encounter: Payer: Self-pay | Admitting: *Deleted

## 2022-04-21 ENCOUNTER — Other Ambulatory Visit (HOSPITAL_BASED_OUTPATIENT_CLINIC_OR_DEPARTMENT_OTHER): Payer: Self-pay

## 2022-04-21 ENCOUNTER — Other Ambulatory Visit: Payer: Self-pay | Admitting: Family

## 2022-04-21 DIAGNOSIS — F909 Attention-deficit hyperactivity disorder, unspecified type: Secondary | ICD-10-CM

## 2022-04-27 ENCOUNTER — Other Ambulatory Visit (HOSPITAL_BASED_OUTPATIENT_CLINIC_OR_DEPARTMENT_OTHER): Payer: Self-pay

## 2022-08-21 NOTE — Progress Notes (Unsigned)
New Patient Office Visit  Subjective    Patient ID: Clayton Avery, male    DOB: [birth year]  Age: 22 y.o. MRN: [id number]  CC: No chief complaint on file.   HPI Neeraj Pedicini Haskins presents to establish care with new provider.   Patients previous primary care provider was Janeece Agee, NP. Last appointment was 01/25/2021. He has seen Dr. Neena Rhymes and Worthy Rancher, FNP in the same office after last appointment with Kateri Plummer, NP.   Specialist:   ADHD: Chronic. Patient is taking Adderall 20mg  twice day. Based on review of chart, patient was last seen on 08/24/2021 for ADHD with an increase in Adderall to BID instead of once a day. He was suppose to follow up in 4-6 weeks to how medication was working.   Outpatient Encounter Medications as of 08/22/2022  Medication Sig   amphetamine-dextroamphetamine (ADDERALL) 20 MG tablet Take 1 tablet (20 mg total) by mouth 2 (two) times daily.   clindamycin (CLEOCIN T) 1 % SWAB Apply 1 pledgette to skin every morning   cyclobenzaprine (FLEXERIL) 5 MG tablet Take 2 tablets (10 mg total) by mouth 2 (two) times daily as needed for muscle spasms.   HYDROcodone-acetaminophen (NORCO/VICODIN) 5-325 MG tablet Take 1 tablet by mouth every 8 (eight) hours as needed.   ibuprofen (ADVIL) 600 MG tablet Take 1 tablet (600 mg total) by mouth every 6 (six) hours as needed.   minocycline (MINOCIN) 100 MG capsule Take 1 capsule (100 mg total) by mouth 2 (two) times daily.   ondansetron (ZOFRAN-ODT) 4 MG disintegrating tablet Take 1 tablet (4 mg total) by mouth every 8 (eight) hours as needed for nausea or vomiting.   tretinoin (RETIN-A) 0.025 % cream Apply a small amount every evening as directed   No facility-administered encounter medications on file as of 08/22/2022.    Past Medical History:  Diagnosis Date   ADHD (attention deficit hyperactivity disorder)    ADHD (attention deficit hyperactivity disorder)    Asthma    Migraine    Otitis     Tethered cord (HCC)     Past Surgical History:  Procedure Laterality Date   BACK SURGERY     LUMBAR LAMINECTOMY FOR TETHERED CORD RELEASE  2003   spinal cord surgery     tubes in ears      No family history on file.  Social History   Socioeconomic History   Marital status: Single    Spouse name: Not on file   Number of children: Not on file   Years of education: Not on file   Highest education level: Not on file  Occupational History   Not on file  Tobacco Use   Smoking status: Some Days    Types: Cigarettes   Smokeless tobacco: Never  Vaping Use   Vaping status: Never Used  Substance and Sexual Activity   Alcohol use: Yes    Comment: occasional   Drug use: Not Currently    Comment: occasional   Sexual activity: Not Currently  Other Topics Concern   Not on file  Social History Narrative   ** Merged History Encounter **       Social Determinants of Health   Financial Resource Strain: Low Risk  (10/27/2020)   Received from [facility], [facility]   Overall Financial Resource Strain (CARDIA)    Difficulty of Paying Living Expenses: Not very hard  Food Insecurity: No Food Insecurity (10/27/2020)   Received from [facility], [facility]   Hunger Vital Sign    Worried About Running Out of Food in the Last Year: Never true    Ran Out of Food in the Last Year: Never true  Transportation Needs: No Transportation Needs (10/27/2020)   Received from [facility], [facility]   [facility] - Transportation    Lack of Transportation (Medical): No    Lack of Transportation (Non-Medical): No  Physical Activity: Not on file  Stress: Not on file  Social Connections: Not on file  Intimate Partner Violence: Not on file    ROS See HPI above    Objective    There were no vitals taken for this visit.  Physical Exam    Assessment & Plan:  There are no diagnoses linked to this encounter. Review health maintenance:  -Influenza vaccine: -Covid  vaccine/booster:  -Tdap vaccine:  No follow-ups on file.   Zandra Abts, NP

## 2022-08-22 ENCOUNTER — Other Ambulatory Visit (HOSPITAL_BASED_OUTPATIENT_CLINIC_OR_DEPARTMENT_OTHER): Payer: Self-pay

## 2022-08-22 ENCOUNTER — Ambulatory Visit (INDEPENDENT_AMBULATORY_CARE_PROVIDER_SITE_OTHER): Payer: 59 | Admitting: Family Medicine

## 2022-08-22 ENCOUNTER — Encounter: Payer: Self-pay | Admitting: Family Medicine

## 2022-08-22 VITALS — BP 112/70 | HR 86 | Temp 98.3°F | Ht 74.0 in | Wt 172.5 lb

## 2022-08-22 DIAGNOSIS — Z23 Encounter for immunization: Secondary | ICD-10-CM | POA: Diagnosis not present

## 2022-08-22 DIAGNOSIS — F909 Attention-deficit hyperactivity disorder, unspecified type: Secondary | ICD-10-CM | POA: Diagnosis not present

## 2022-08-22 DIAGNOSIS — L03011 Cellulitis of right finger: Secondary | ICD-10-CM | POA: Diagnosis not present

## 2022-08-22 DIAGNOSIS — L709 Acne, unspecified: Secondary | ICD-10-CM

## 2022-08-22 DIAGNOSIS — Z7689 Persons encountering health services in other specified circumstances: Secondary | ICD-10-CM

## 2022-08-22 LAB — POCT URINE DRUG SCREEN
POC Amphetamine UR: NOT DETECTED
POC BENZODIAZEPINES UR: NOT DETECTED
POC Barbiturate UR: NOT DETECTED
POC Cocaine UR: NOT DETECTED
POC Ecstasy UR: NOT DETECTED
POC Marijuana UR: NOT DETECTED
POC Methadone UR: NOT DETECTED
POC Methamphetamine UR: NOT DETECTED
POC Opiate Ur: NOT DETECTED
POC Oxycodone UR: NOT DETECTED
POC PHENCYCLIDINE UR: NOT DETECTED
POC TRICYCLICS UR: NOT DETECTED
URINE TEMPERATURE: 96 Degrees F (ref 90.0–100.0)

## 2022-08-22 MED ORDER — AMPHETAMINE-DEXTROAMPHETAMINE 20 MG PO TABS
20.0000 mg | ORAL_TABLET | Freq: Two times a day (BID) | ORAL | 0 refills | Status: DC
  Filled 2022-08-22: qty 60, 30d supply, fill #0

## 2022-08-22 MED ORDER — DOXYCYCLINE HYCLATE 100 MG PO TABS
100.0000 mg | ORAL_TABLET | Freq: Two times a day (BID) | ORAL | 0 refills | Status: AC
  Filled 2022-08-22: qty 14, 7d supply, fill #0

## 2022-08-22 MED ORDER — CLINDAMYCIN PHOSPHATE 1 % EX SWAB
1.0000 | Freq: Every morning | CUTANEOUS | 5 refills | Status: DC
  Filled 2022-08-22: qty 60, 60d supply, fill #0

## 2022-08-22 NOTE — Assessment & Plan Note (Addendum)
Will restart Adderall 20mg  BID daily for ADHD. He reports when he previously took this medication BID, it was effective and better than taking the extended release. UDS completed today with no concerns. Reviewed PDMP. Patient and provider signed controlled substance contract. He received a copy of the contract and will need to renewed in 1 year. Instructed patient to follow up or send a MyChart message if he was having difficulty with restarting medication.

## 2022-08-22 NOTE — Assessment & Plan Note (Signed)
Refilled Clindamycin for acne. He reports it was effective when he took it previously.  Advised to please send a MyChart message if he can find who his previous dermatologist was.

## 2022-08-22 NOTE — Patient Instructions (Signed)
-  It was a pleasure to meet you today and take care of you. -START Adderall 20mg  tablet, 1 tablet twice a day for ADHD. If you have any complications with restarting medication, please follow up sooner or send a MyChart message.  -Your urine drug screen was completed today with no concerns. This will be completed periodically. Patient and provider signed substance controlled contract. This will need to be renewed every year.  -Refilled Clindamycin for acne. Please send a MyChart message if you find who your previous dermatologist was.  -START Doxycycline 100mg  tablet, 1 tablet twice a day for 7 days for nail infection.  -Provided written information about your nail bed being infected. Please cleanse area with Dial Antibacterial Soap, pat dry twice a day, and keep covered until healed. If it was to become worse, please follow up.  -Follow up for a physical by the end of the month and a 3 month chronic management.

## 2022-09-27 ENCOUNTER — Other Ambulatory Visit (HOSPITAL_BASED_OUTPATIENT_CLINIC_OR_DEPARTMENT_OTHER): Payer: Self-pay

## 2022-09-27 ENCOUNTER — Telehealth (INDEPENDENT_AMBULATORY_CARE_PROVIDER_SITE_OTHER): Payer: BC Managed Care – PPO | Admitting: Family Medicine

## 2022-09-27 VITALS — Temp 98.0°F | Ht 74.0 in | Wt 172.0 lb

## 2022-09-27 DIAGNOSIS — U071 COVID-19: Secondary | ICD-10-CM

## 2022-09-27 MED ORDER — NIRMATRELVIR/RITONAVIR (PAXLOVID)TABLET
3.0000 | ORAL_TABLET | Freq: Two times a day (BID) | ORAL | 0 refills | Status: AC
  Filled 2022-09-27: qty 30, 5d supply, fill #0

## 2022-09-27 NOTE — Progress Notes (Signed)
Virtual Visit via Video Note  I connected with Clayton Avery on 09/27/22 at 10:56 AM by a video enabled telemedicine application and verified that I am speaking with the correct person using two identifiers.   Patient Location: Home Provider Location: office - [facility].    I discussed the limitations, risks, security and privacy concerns of performing an evaluation and management service by telephone and the availability of in person appointments. I also discussed with the patient that there may be a patient responsible charge related to this service. The patient expressed understanding and agreed to proceed, consent obtained  Chief Complaint  Patient presents with   Covid Positive    Pt tested positive for COVID. SX today nasal congestion,cough,headache, sore throat, fatigue and body. Pt reports unable to check tempeture, weight at home     History of Present Illness: Clayton Avery is a 22 y.o. male that tested positive for covid at home.   +nasal congestion +dry cough +more frontal, occipital headache +sore throat +fatigue  +body aches  +SHOB  +sweating/chills  +ear pain/pressure   -chest pain -fever  Symptoms started on Monday, but got worse yesterday. He reports on Tuesday and part of Wednesday he felt a little better. He reports he took OTC Cough/Congestion medication, cough drops, and immune support spray. Not been helping.   Denies any recent travel. Not been around any one sick that he is aware of.   Patient Active Problem List   Diagnosis Date Noted   Attention deficit hyperactivity disorder (ADHD) 08/22/2022   Acne 08/22/2022   Alcohol use 10/26/2020   Generalized anxiety disorder 10/26/2020   Mood disorder (HCC) 10/26/2020   Past Medical History:  Diagnosis Date   ADHD (attention deficit hyperactivity disorder)    ADHD (attention deficit hyperactivity disorder)    Asthma    Migraine    Otitis    Tethered cord (HCC)    As a  infant   Past Surgical History:  Procedure Laterality Date   BACK SURGERY     as a infant   LUMBAR LAMINECTOMY FOR TETHERED CORD RELEASE  2003   spinal cord surgery     as infant   tubes in ears     as a child   No Known Allergies Prior to Admission medications   Medication Sig Start Date End Date Taking? Authorizing Provider  amphetamine-dextroamphetamine (ADDERALL) 20 MG tablet Take 1 tablet (20 mg total) by mouth 2 (two) times daily. 08/22/22  Yes Alveria Apley, NP  clindamycin (CLEOCIN T) 1 % SWAB Use 1 pledgette topically every morning. 08/22/22  Yes Alveria Apley, NP   Social History   Socioeconomic History   Marital status: Single    Spouse name: Not on file   Number of children: 0   Years of education: Not on file   Highest education level: Some college, no degree  Occupational History   Not on file  Tobacco Use   Smoking status: Former    Current packs/day: 0.00    Average packs/day: 0.3 packs/day for 5.0 years (1.3 ttl pk-yrs)    Types: Cigarettes    Start date: 01/02/2016    Quit date: 01/01/2021    Years since quitting: 1.7   Smokeless tobacco: Never  Vaping Use   Vaping status: Never Used  Substance and Sexual Activity   Alcohol use: Yes    Comment: 1x month 2-3 drinks   Drug use: Not Currently   Sexual activity: Yes  Other  Topics Concern   Not on file  Social History Narrative   Not on file   Social Determinants of Health   Financial Resource Strain: Low Risk  (10/27/2020)   Received from [facility], [facility]   Overall Financial Resource Strain (CARDIA)    Difficulty of Paying Living Expenses: Not very hard  Food Insecurity: No Food Insecurity (08/22/2022)   Hunger Vital Sign    Worried About Running Out of Food in the Last Year: Never true    Ran Out of Food in the Last Year: Never true  Transportation Needs: No Transportation Needs (08/22/2022)   PRAPARE - Administrator, Civil Service (Medical): No    Lack of  Transportation (Non-Medical): No  Physical Activity: Sufficiently Active (08/22/2022)   Exercise Vital Sign    Days of Exercise per Week: 7 days    Minutes of Exercise per Session: 120 min  Stress: No Stress Concern Present (08/22/2022)   [employer] of Occupational Health - Occupational Stress Questionnaire    Feeling of Stress : Only a little  Social Connections: Socially Isolated (08/22/2022)   Social Connection and Isolation Panel [NHANES]    Frequency of Communication with Friends and Family: More than three times a week    Frequency of Social Gatherings with Friends and Family: Three times a week    Attends Religious Services: Never    Active Member of Clubs or Organizations: No    Attends Banker Meetings: Never    Marital Status: Never married  Intimate Partner Violence: Not At Risk (08/22/2022)   Humiliation, Afraid, Rape, and Kick questionnaire    Fear of Current or Ex-Partner: No    Emotionally Abused: No    Physically Abused: No    Sexually Abused: No    Observations/Objective: Today's Vitals   09/27/22 1010  Temp: 98 F (36.7 C)  Weight: 172 lb (78 kg)  Height: 6\' 2"  (1.88 m)   Physical Exam Constitutional:      General: He is not in acute distress.    Appearance: Normal appearance. He is not ill-appearing, toxic-appearing or diaphoretic.  HENT:     Head: Normocephalic and atraumatic.     Nose:     Comments: Sounded stuffy  Eyes:     General:        Right eye: No discharge.        Left eye: No discharge.     Conjunctiva/sclera: Conjunctivae normal.  Pulmonary:     Effort: Pulmonary effort is normal. No respiratory distress.  Skin:    General: Skin is dry.  Neurological:     General: No focal deficit present.     Mental Status: He is alert and oriented to person, place, and time. Mental status is at baseline.  Psychiatric:        Mood and Affect: Mood normal.        Behavior: Behavior normal.        Thought Content: Thought content  normal.        Judgment: Judgment normal.     Assessment and Plan: COVID -     nirmatrelvir/ritonavir; Take 3 tablets by mouth 2 (two) times daily for 5 days. (Take nirmatrelvir 150 mg two tablets twice daily for 5 days and ritonavir 100 mg one tablet twice daily for 5 days) Patient GFR is >60.  Dispense: 30 tablet; Refill: 0  -Prescribed Paxlovid for covid.  -Recommend supportive care, rest, hydrate.  -Alternate Tylenol 1000mg  and  Ibuprofen 600-800mg  every 4 hours for pain, headache, and body aches. Recommend to eat something when taking Ibuprofen.   -May use Mucinex and cough drops.  -Discussed about covid quarantine guidelines.  -If you develop chest pain or severe shortness of breath of what you have already experienced, go directly to the emergency department.  -May take over the counter supplements to support her immune system. Vitamin D 1000IU, Vitamin C 1000mg , or Zinc 50-100mg .  -Provide school note to be excuse today and return on Tuesday.  Follow Up Instructions: If not improved.    I discussed the assessment and treatment plan with the patient. The patient was provided an opportunity to ask questions and all were answered. The patient agreed with the plan and demonstrated an understanding of the instructions.   The patient was advised to call back or seek an in-person evaluation if the symptoms worsen or if the condition fails to improve as anticipated.  Zandra Abts, NP

## 2022-09-28 ENCOUNTER — Encounter: Payer: Self-pay | Admitting: Family Medicine

## 2022-09-28 ENCOUNTER — Ambulatory Visit: Payer: 59 | Admitting: Family Medicine

## 2022-10-05 ENCOUNTER — Other Ambulatory Visit (HOSPITAL_BASED_OUTPATIENT_CLINIC_OR_DEPARTMENT_OTHER): Payer: Self-pay

## 2022-10-08 ENCOUNTER — Other Ambulatory Visit: Payer: Self-pay | Admitting: Family Medicine

## 2022-10-08 ENCOUNTER — Other Ambulatory Visit (HOSPITAL_BASED_OUTPATIENT_CLINIC_OR_DEPARTMENT_OTHER): Payer: Self-pay

## 2022-10-08 DIAGNOSIS — F909 Attention-deficit hyperactivity disorder, unspecified type: Secondary | ICD-10-CM

## 2022-10-08 MED ORDER — AMPHETAMINE-DEXTROAMPHETAMINE 20 MG PO TABS
20.0000 mg | ORAL_TABLET | Freq: Two times a day (BID) | ORAL | 0 refills | Status: DC
  Filled 2022-10-08 – 2023-01-12 (×2): qty 60, 30d supply, fill #0

## 2022-10-19 ENCOUNTER — Other Ambulatory Visit (HOSPITAL_BASED_OUTPATIENT_CLINIC_OR_DEPARTMENT_OTHER): Payer: Self-pay

## 2022-11-06 IMAGING — DX DG ELBOW COMPLETE 3+V*L*
4 series · 4 of 4 positions shown · non-contrast
Comparison: None.

CLINICAL DATA: Medial epicondyle pain.  Injury.

EXAM:
LEFT ELBOW - COMPLETE 3+ VIEW

[elbow ap]
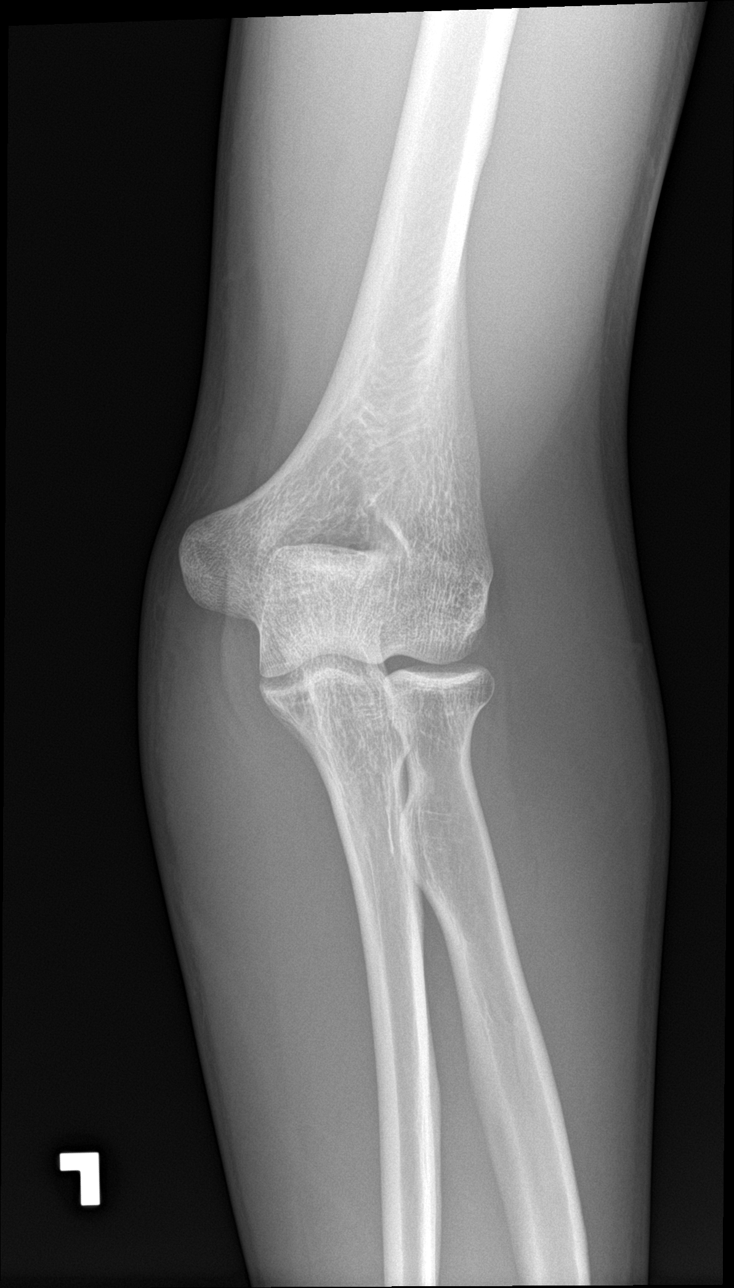

[elbow obl (1 of 2)]
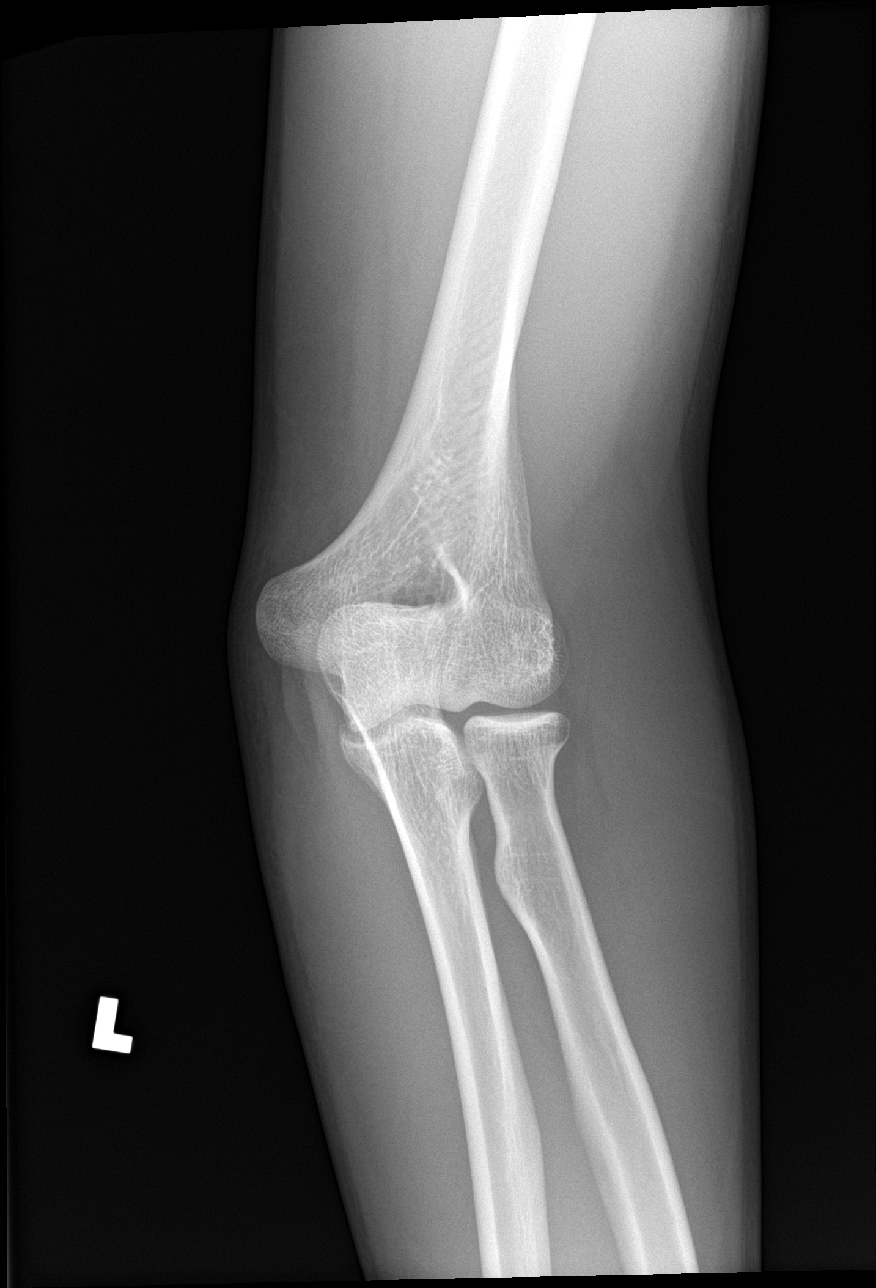

[elbow obl (2 of 2)]
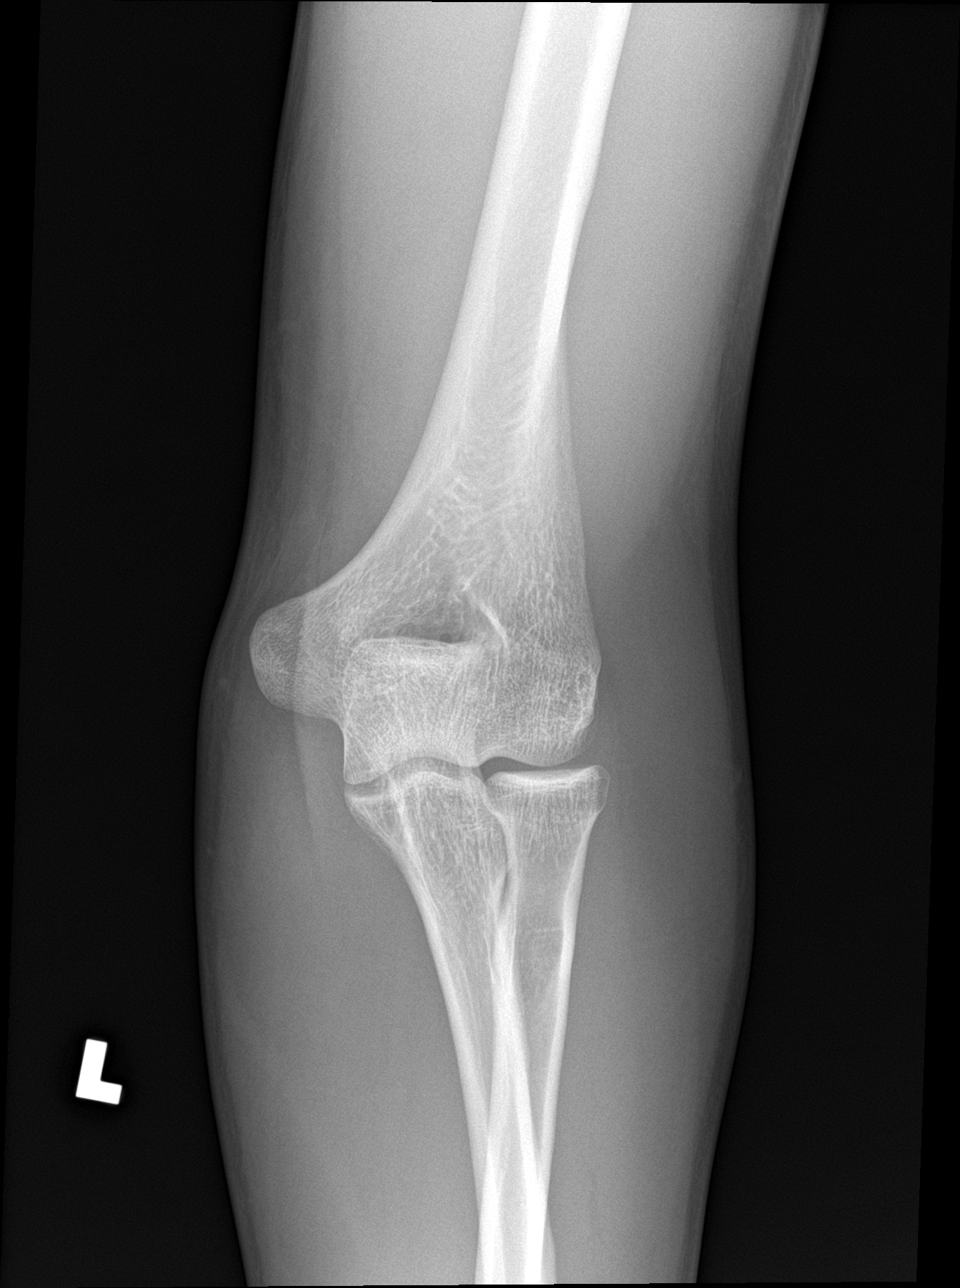

[elbow lat]
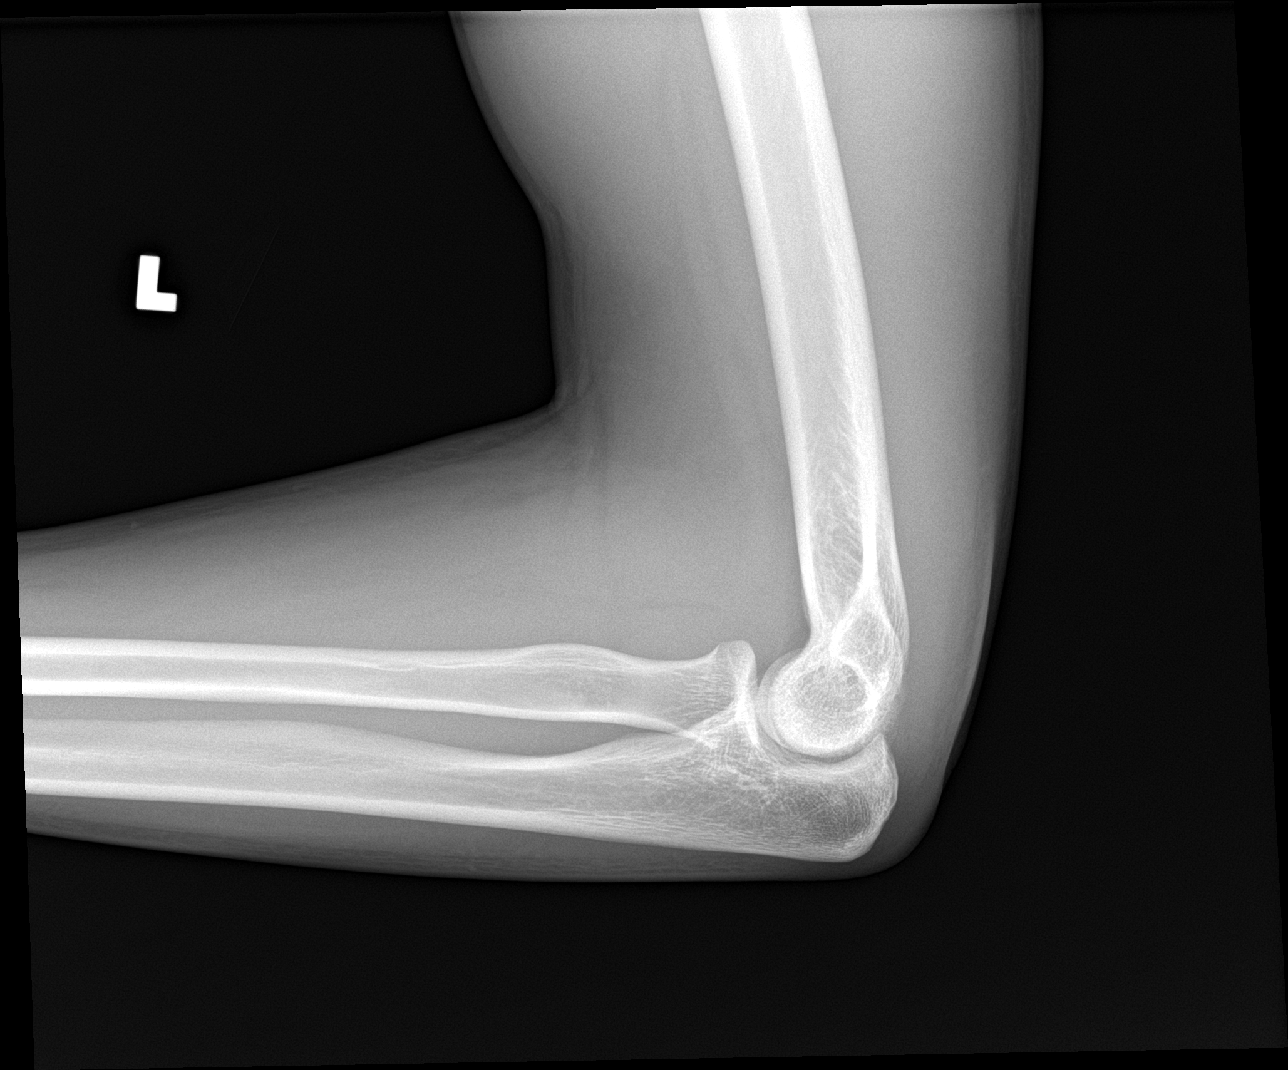

[4 of 4 positions shown; findings below may reference images not displayed]

FINDINGS: There is no evidence of fracture, dislocation, or joint effusion.
Normal alignment and joint spaces. There is no evidence of
arthropathy or other focal bone abnormality. Minor medial soft
tissue edema.
IMPRESSION: Minor medial soft tissue edema. No osseous abnormality.

## 2022-11-15 ENCOUNTER — Encounter: Payer: Self-pay | Admitting: Family Medicine

## 2022-11-15 ENCOUNTER — Ambulatory Visit: Payer: BC Managed Care – PPO | Admitting: Family Medicine

## 2022-11-15 ENCOUNTER — Encounter (HOSPITAL_BASED_OUTPATIENT_CLINIC_OR_DEPARTMENT_OTHER): Payer: Self-pay | Admitting: Emergency Medicine

## 2022-11-15 ENCOUNTER — Other Ambulatory Visit: Payer: Self-pay

## 2022-11-15 ENCOUNTER — Emergency Department (HOSPITAL_BASED_OUTPATIENT_CLINIC_OR_DEPARTMENT_OTHER)
Admission: EM | Admit: 2022-11-15 | Discharge: 2022-11-15 | Disposition: A | Discharge status: Home / Self Care | Payer: BC Managed Care – PPO | Attending: Emergency Medicine | Admitting: Emergency Medicine

## 2022-11-15 VITALS — BP 160/108 | HR 111 | Temp 97.7°F | Ht 74.0 in | Wt 165.6 lb

## 2022-11-15 DIAGNOSIS — R000 Tachycardia, unspecified: Secondary | ICD-10-CM

## 2022-11-15 DIAGNOSIS — R519 Headache, unspecified: Secondary | ICD-10-CM | POA: Diagnosis not present

## 2022-11-15 DIAGNOSIS — R52 Pain, unspecified: Secondary | ICD-10-CM

## 2022-11-15 DIAGNOSIS — M791 Myalgia, unspecified site: Secondary | ICD-10-CM | POA: Diagnosis not present

## 2022-11-15 DIAGNOSIS — I10 Essential (primary) hypertension: Secondary | ICD-10-CM | POA: Insufficient documentation

## 2022-11-15 DIAGNOSIS — R030 Elevated blood-pressure reading, without diagnosis of hypertension: Secondary | ICD-10-CM

## 2022-11-15 DIAGNOSIS — Z1152 Encounter for screening for COVID-19: Secondary | ICD-10-CM | POA: Insufficient documentation

## 2022-11-15 DIAGNOSIS — R42 Dizziness and giddiness: Secondary | ICD-10-CM

## 2022-11-15 LAB — BASIC METABOLIC PANEL
Anion gap: 12 (ref 5–15)
BUN: 15 mg/dL (ref 6–20)
CO2: 23 mmol/L (ref 22–32)
Calcium: 9.9 mg/dL (ref 8.9–10.3)
Chloride: 101 mmol/L (ref 98–111)
Creatinine, Ser: 0.92 mg/dL (ref 0.61–1.24)
GFR, Estimated: >60 mL/min (ref >60)
Glucose, Bld: 85 mg/dL (ref 70–99)
Potassium: 3.8 mmol/L (ref 3.5–5.1)
Sodium: 136 mmol/L (ref 135–145)

## 2022-11-15 LAB — CBC
HCT: 47 % (ref 39.0–52.0)
Hemoglobin: 16.7 g/dL (ref 13.0–17.0)
MCH: 29.6 pg (ref 26.0–34.0)
MCHC: 35.5 g/dL (ref 30.0–36.0)
MCV: 83.3 fL (ref 80.0–100.0)
Platelets: 226 10*3/uL (ref 150–400)
RBC: 5.64 MIL/uL (ref 4.22–5.81)
RDW: 13 % (ref 11.5–15.5)
WBC: 6.6 10*3/uL (ref 4.0–10.5)
nRBC: 0 % (ref 0.0–0.2)

## 2022-11-15 LAB — URINALYSIS, ROUTINE W REFLEX MICROSCOPIC
Bacteria, UA: NONE SEEN
Bilirubin Urine: NEGATIVE
Glucose, UA: NEGATIVE mg/dL
Ketones, ur: 40 mg/dL — AB
Leukocytes,Ua: NEGATIVE
Nitrite: NEGATIVE
Protein, ur: NEGATIVE mg/dL
Specific Gravity, Urine: 1.027 (ref 1.005–1.030)
pH: 6.5 (ref 5.0–8.0)

## 2022-11-15 LAB — RAPID URINE DRUG SCREEN, HOSP PERFORMED
Amphetamines: POSITIVE — AB
Barbiturates: NOT DETECTED
Benzodiazepines: NOT DETECTED
Cocaine: NOT DETECTED
Opiates: NOT DETECTED
Tetrahydrocannabinol: NOT DETECTED

## 2022-11-15 LAB — POC COVID19 BINAXNOW: SARS Coronavirus 2 Ag: NEGATIVE

## 2022-11-15 LAB — RESP PANEL BY RT-PCR (RSV, FLU A&B, COVID)  RVPGX2
Influenza A by PCR: NEGATIVE
Influenza B by PCR: NEGATIVE
Resp Syncytial Virus by PCR: NEGATIVE
SARS Coronavirus 2 by RT PCR: NEGATIVE

## 2022-11-15 LAB — CK: Total CK: 82 U/L (ref 49–397)

## 2022-11-15 LAB — SEDIMENTATION RATE: Sed Rate: 28 mm/h — ABNORMAL HIGH (ref 0–16)

## 2022-11-15 LAB — C-REACTIVE PROTEIN: CRP: 0.5 mg/dL (ref <1.0)

## 2022-11-15 MED ORDER — SODIUM CHLORIDE 0.9 % IV BOLUS
1000.0000 mL | Freq: Once | INTRAVENOUS | Status: AC
  Administered 2022-11-15: 1000 mL via INTRAVENOUS

## 2022-11-15 MED ORDER — KETOROLAC TROMETHAMINE 15 MG/ML IJ SOLN
15.0000 mg | Freq: Once | INTRAMUSCULAR | Status: AC
  Administered 2022-11-15: 15 mg via INTRAMUSCULAR
  Filled 2022-11-15: qty 1

## 2022-11-15 NOTE — ED Triage Notes (Signed)
Generalized body aches, mental fog. Muscle pains Feeling anxious Started 2 days ago High bp at home 160/110 Not eating or drinking well

## 2022-11-15 NOTE — Progress Notes (Signed)
Established Patient Office Visit   Subjective:  Patient ID: Clayton Avery, male    DOB: 02/01/2000  Age: 22 y.o. MRN: [id number]  Chief Complaint  Patient presents with   Generalized Body Aches    Patient complains of body aches, x2 days     HPI Patient is complaining of body aches and muscle aches that started 2-3 days ago. He denies any other symptoms related to covid or a cold. Denies having chest pain or shortness of breath. However, he reports driving here he felt "weird", like the road was moving, hard to focus, dizzy and lightheaded. Reports he has a headache, frontal headache. He is unsure of when his headache started.  ROS See HPI above     Objective:   BP (!) 160/108 (BP Location: Left Arm, Patient Position: Sitting, Cuff Size: Normal)   Pulse (!) 111   Temp 97.7 F (36.5 C) (Oral)   Ht 6\' 2"  (1.88 m)   Wt 165 lb 9.6 oz (75.1 kg)   SpO2 99%   BMI 21.26 kg/m    Physical Exam Vitals reviewed.  Constitutional:      General: He is not in acute distress.    Appearance: He is diaphoretic (Mild). He is not ill-appearing or toxic-appearing.  HENT:     Head: Normocephalic and atraumatic.  Eyes:     General:        Right eye: No discharge.        Left eye: No discharge.     Conjunctiva/sclera: Conjunctivae normal.  Cardiovascular:     Rate and Rhythm: Regular rhythm. Tachycardia present.     Heart sounds: Normal heart sounds. No murmur heard.    No friction rub. No gallop.  Pulmonary:     Effort: Pulmonary effort is normal. No respiratory distress.     Breath sounds: Normal breath sounds.  Musculoskeletal:        General: Normal range of motion.  Skin:    General: Skin is warm.  Neurological:     General: No focal deficit present.     Mental Status: He is alert and oriented to person, place, and time. Mental status is at baseline.     Cranial Nerves: Cranial nerves 2-12 are intact. No facial asymmetry.     Motor: No weakness.     Coordination: Romberg  sign negative.  Psychiatric:        Mood and Affect: Mood is anxious.        Speech: Speech normal.        Behavior: Behavior normal. Behavior is cooperative.        Thought Content: Thought content normal.        Judgment: Judgment normal.   -EKG performed due to dizziness, elevated blood pressure, and pulse. Sinus Rhythm with no ST elevation. Rate 96.No comparison found for another EKG.    Assessment & Plan:  Elevated pulse rate -     EKG 12-Lead  Elevated blood pressure reading in office without diagnosis of hypertension -     EKG 12-Lead  Acute nonintractable headache, unspecified headache type -     POC COVID-19 BinaxNow  Dizziness -     EKG 12-Lead  Muscle pain  Body aches -     POC COVID-19 BinaxNow  -Due to symptoms of dizziness, lightheadedness, elevated blood pressure, headache, and  tachycardia, recommend patient go directly to the closes emergency department to be further evaluated. Patient is being transported by private vehicle with his sister.  -  Rapid covid is negative.  -EKG performed.  No follow-ups on file.   Zandra Abts, NP

## 2022-11-15 NOTE — Discharge Instructions (Signed)
As we discussed, I would like for you to start taking a blood pressure once in the morning and once in the evening.  Do this over consecutive period of days and if it is consistently elevated I would follow-up with your primary care doctor.  Please drink plenty of fluids.  You can take 600 mg of ibuprofen for your muscle pains.  You may return to the emergency department for any worsening symptoms.

## 2022-11-15 NOTE — ED Provider Notes (Signed)
Clayton Avery Provider Note   CSN: [id number] Arrival date & time: 11/15/22  1624     History Chief Complaint  Patient presents with   Generalized Body Aches    Clayton Avery is a 22 y.o. male with history of ADHD who presents to the emergency department today for further evaluation of myalgias and fatigue.  This has been present for 3 to 4 days and worsening.  He denies any strenuous workouts, drug use, alcohol use.  He denies fever, chills, numbness, cough, congestion.  Nothing seems to make this better or worse.  HPI     Home Medications Prior to Admission medications   Medication Sig Start Date End Date Taking? Authorizing Provider  amphetamine-dextroamphetamine (ADDERALL) 20 MG tablet Take 1 tablet (20 mg total) by mouth 2 (two) times daily. 10/08/22   Alveria Apley, NP  clindamycin (CLEOCIN T) 1 % SWAB Use 1 pledgette topically every morning. 08/22/22   Alveria Apley, NP      Allergies    Patient has no known allergies.    Review of Systems   Review of Systems  All other systems reviewed and are negative.   Physical Exam Updated Vital Signs BP (!) 141/80 (BP Location: Left Arm)   Pulse 71   Temp 98 F (36.7 C) (Oral)   Resp 18   SpO2 100%  Physical Exam Vitals and nursing note reviewed.  Constitutional:      General: He is not in acute distress.    Appearance: Normal appearance.  HENT:     Head: Normocephalic and atraumatic.  Eyes:     General:        Right eye: No discharge.        Left eye: No discharge.  Cardiovascular:     Rate and Rhythm: Regular rhythm. Tachycardia present.     Pulses: No decreased pulses.     Heart sounds: Normal heart sounds.  Pulmonary:     Comments: Clear to auscultation bilaterally.  Normal effort.  No respiratory distress.  No evidence of wheezes, rales, or rhonchi heard throughout. Abdominal:     General: Abdomen is flat. Bowel sounds are normal. There is no  distension.     Tenderness: There is no abdominal tenderness. There is no guarding or rebound.  Musculoskeletal:        General: Normal range of motion.     Cervical back: Neck supple.     Comments: 5/5 strength to the upper and lower extremities.  Normal sensation to the upper and lower extremities.  Skin:    General: Skin is warm and dry.     Findings: No rash.  Neurological:     General: No focal deficit present.     Mental Status: He is alert.  Psychiatric:        Mood and Affect: Mood normal.        Behavior: Behavior normal.     ED Results / Procedures / Treatments   Labs (all labs ordered are listed, but only abnormal results are displayed) Labs Reviewed  URINALYSIS, ROUTINE W REFLEX MICROSCOPIC - Abnormal; Notable for the following components:      Result Value   Hgb urine dipstick TRACE (*)    Ketones, ur 40 (*)    All other components within normal limits  SEDIMENTATION RATE - Abnormal; Notable for the following components:   Sed Rate 28 (*)    All other components within normal limits  RAPID  URINE DRUG SCREEN, HOSP PERFORMED - Abnormal; Notable for the following components:   Amphetamines POSITIVE (*)    All other components within normal limits  RESP PANEL BY RT-PCR (RSV, FLU A&B, COVID)  [id number]  BASIC METABOLIC PANEL  CBC  CK  C-REACTIVE PROTEIN    EKG None  Radiology No results found.  Procedures Procedures    Medications Ordered in ED Medications  sodium chloride 0.9 % bolus 1,000 mL (1,000 mLs Intravenous New Bag/Given 11/15/22 2014)  ketorolac (TORADOL) 15 MG/ML injection 15 mg (15 mg Intramuscular Given 11/15/22 2013)    ED Course/ Medical Decision Making/ A&P Clinical Course as of 11/15/22 2153  Thu Nov 15, 2022  2043 Resp panel by RT-PCR (RSV, Flu A&B, Covid) Anterior Nasal Swab Negative.  [CF]  2102 CBC Normal. [CF]  2102 CK Normal. [CF]  2102 Basic metabolic panel Normal. [CF]  2102 Urinalysis, Routine w reflex microscopic  -Urine, Clean Catch(!) Negative. [CF]  2119 Sedimentation rate(!) Prolonged. [CF]  2149 C-reactive protein Normal.  [CF]  2149 Rapid urine drug screen (hospital performed)(!) Positive for amphetamines.  In the context of ADHD medication. [CF]  2150 On reevaluation, patient's blood pressure has improved greatly and his heart rate has normalized with fluids.  Patient is feeling better.  I went over all labs and imaging with him at the bedside.  We also started talking about keeping a blood pressure journal.  Patient was very concerned about his blood pressure and I reassured that he is otherwise young and healthy and keeping a journal will be appropriate that he can take to his primary care doctor if his blood pressure continues to be elevated over a consecutive period of days.  He was encouraged to return to the emergency department for any worsening symptoms. [CF]    Clinical Course User Index [CF] Teressa Lower, PA-C   {   Click here for ABCD2, HEART and other calculators  Medical Decision Making My Sidel Clayton Avery is a 22 y.o. male patient who presents to the emergency department today for further evaluation of muscle pain and fatigue.  Differential diagnosis does include polymyositis, viral illness, rhabdomyolysis.  Labs were obtained in triage interpreted by myself.  CK level is normal at this time. I will add on ESR, CRP, drug screen, and respiratory panel to further assess.  Will give the patient some fluids and place him on a cardiac monitor.  Patient feeling better.  Will treat conservatively.  Heart rate and blood pressure have normalized.  Strict return precautions were discussed.  He is safe for discharge.  Amount and/or Complexity of Data Reviewed Labs: ordered. Decision-making details documented in ED Course.  Risk Prescription drug management.    Final Clinical Impression(s) / ED Diagnoses Final diagnoses:  Myalgia  Hypertension, unspecified type    Rx / DC  Orders ED Discharge Orders     None         Jolyn Lent 11/15/22 2153    Melene Plan, DO 11/15/22 2233

## 2022-11-15 NOTE — ED Provider Triage Note (Signed)
Emergency Medicine Provider Triage Evaluation Note  Iago Cottingham Menken , a 22 y.o. male  was evaluated in triage.  Pt complains of general body aches. Started yesterday. States went to [employer] after and symptoms started later that day. Has muscle aches all over. Endorses poor hydration in the last few days. Denies cocaine and alcohol use. Denies fever.   Review of Systems  Positive: See above Negative: See above  Physical Exam  BP (!) 151/98 (BP Location: Right Arm)   Pulse (!) 133   Temp 98.9 F (37.2 C) (Oral)   Resp 20   SpO2 100%  Gen:   Awake, no distress   Resp:  Normal effort  MSK:   Moves extremities without difficulty  Other:    Medical Decision Making  Medically screening exam initiated at 5:48 PM.  Appropriate orders placed.  Ulyess Blossom Hanford was informed that the remainder of the evaluation will be completed by another provider, this initial triage assessment does not replace that evaluation, and the importance of remaining in the ED until their evaluation is complete.  Work up started   Gareth Eagle, PA-C 11/15/22 1751

## 2022-11-16 LAB — BASIC METABOLIC PANEL
BUN: 13 (ref 4–21)
CO2: 26 — AB (ref 13–22)
Chloride: 101 (ref 99–108)
Creatinine: 1 (ref 0.6–1.3)
Potassium: 4.5 meq/L (ref 3.5–5.1)
Sodium: 137 (ref 137–147)

## 2022-11-16 LAB — HEPATIC FUNCTION PANEL
ALT: 12 U/L (ref 10–40)
AST: 14 (ref 14–40)

## 2022-11-16 LAB — COMPREHENSIVE METABOLIC PANEL
Albumin: 4.7 (ref 3.5–5.0)
Calcium: 9.5 (ref 8.7–10.7)
Globulin: 2.5
eGFR: 106

## 2022-11-19 ENCOUNTER — Encounter: Payer: Self-pay | Admitting: Family Medicine

## 2022-11-27 NOTE — Telephone Encounter (Signed)
Contacted pt and went over with him on provider's message. Verbalized understanding.   Pt states he will bring in a copy of the labs result to his appt on 12/6 and will come in fasting. He said his glucose was high bc he didn't fasted.

## 2022-12-07 ENCOUNTER — Encounter: Payer: Self-pay | Admitting: Adult Health

## 2022-12-07 ENCOUNTER — Ambulatory Visit: Payer: BC Managed Care – PPO | Admitting: Family Medicine

## 2022-12-07 ENCOUNTER — Ambulatory Visit (INDEPENDENT_AMBULATORY_CARE_PROVIDER_SITE_OTHER): Payer: BC Managed Care – PPO | Admitting: Adult Health

## 2022-12-07 VITALS — BP 120/80 | HR 70 | Temp 97.5°F | Ht 74.0 in | Wt 171.4 lb

## 2022-12-07 DIAGNOSIS — R030 Elevated blood-pressure reading, without diagnosis of hypertension: Secondary | ICD-10-CM | POA: Diagnosis not present

## 2022-12-07 DIAGNOSIS — R5383 Other fatigue: Secondary | ICD-10-CM

## 2022-12-07 DIAGNOSIS — M791 Myalgia, unspecified site: Secondary | ICD-10-CM | POA: Diagnosis not present

## 2022-12-07 NOTE — Progress Notes (Signed)
Subjective:    Patient ID: Clayton Avery, male    DOB: Feb 19, 2000, 22 y.o.   MRN: [id number]  HPI  22 year old male who  has a past medical history of ADHD (attention deficit hyperactivity disorder), ADHD (attention deficit hyperactivity disorder), Asthma, Migraine, Otitis, and Tethered cord (HCC).  He is a patient of Zandra Abts who I am seeing today for follow up.   He was seen in the emergency room on November 15, 2022 for myalgias and fatigue.  At this time his symptoms have been present for 3 to 4 days and worsening.  He denied any strenuous workouts, drug use, alcohol use.  He denied fevers, chills, numbness, cough, congestion.  On exam in the ER his blood pressure was elevated at 141/80 and he was tachycardic.  Labs including CRP, sed rate, CK, urine drug, respiratory panel, CK, CBC, and BMP within normal limits.  He was given IV fluids and his blood pressure and heart rate normalized.  He was advised to monitor his blood pressure at home and follow-up with his PCP.  After his hospital visit he went to a walk in lab company and has is ANA and insulin levels checked on the advise of his parents friend who is a physician. His ANA level was 1:80 . His glucose and insulin levels were also elevated but he was not fasting.   Today he reports that the fatigue and muscle pain has resolved. He has been checking his BP at home with readings in the 130's/80's for the most part. He will have some intermittent spikes where his BP goes upwards to 140/80's.   He has not been eating healthy nor exercising as much   BP Readings from Last 3 Encounters:  12/07/22 120/80  11/15/22 (!) 141/80  11/15/22 (!) 160/108    Review of Systems See HPI   Past Medical History:  Diagnosis Date   ADHD (attention deficit hyperactivity disorder)    ADHD (attention deficit hyperactivity disorder)    Asthma    Migraine    Otitis    Tethered cord (HCC)    As a infant    Social History    Socioeconomic History   Marital status: Single    Spouse name: Not on file   Number of children: 0   Years of education: Not on file   Highest education level: Some college, no degree  Occupational History   Not on file  Tobacco Use   Smoking status: Former    Current packs/day: 0.00    Average packs/day: 0.3 packs/day for 5.0 years (1.3 ttl pk-yrs)    Types: Cigarettes    Start date: 01/02/2016    Quit date: 01/01/2021    Years since quitting: 1.9   Smokeless tobacco: Never  Vaping Use   Vaping status: Never Used  Substance and Sexual Activity   Alcohol use: Yes    Comment: 1x month 2-3 drinks   Drug use: Not Currently   Sexual activity: Yes  Other Topics Concern   Not on file  Social History Narrative   Not on file   Social Determinants of Health   Financial Resource Strain: Low Risk  (10/27/2020)   Received from [facility]   Overall Financial Resource Strain (CARDIA)    Difficulty of Paying Living Expenses: Not very hard  Food Insecurity: No Food Insecurity (08/22/2022)   Hunger Vital Sign    Worried About Running Out of Food in the Last Year:  Never true    Ran Out of Food in the Last Year: Never true  Transportation Needs: No Transportation Needs (08/22/2022)   PRAPARE - Administrator, Civil Service (Medical): No    Lack of Transportation (Non-Medical): No  Physical Activity: Sufficiently Active (08/22/2022)   Exercise Vital Sign    Days of Exercise per Week: 7 days    Minutes of Exercise per Session: 120 min  Stress: No Stress Concern Present (08/22/2022)   [employer] of Occupational Health - Occupational Stress Questionnaire    Feeling of Stress : Only a little  Social Connections: Socially Isolated (08/22/2022)   Social Connection and Isolation Panel [NHANES]    Frequency of Communication with Friends and Family: More than three times a week    Frequency of Social Gatherings with Friends and Family: Three times a week     Attends Religious Services: Never    Active Member of Clubs or Organizations: No    Attends Banker Meetings: Never    Marital Status: Never married  Intimate Partner Violence: Not At Risk (08/22/2022)   Humiliation, Afraid, Rape, and Kick questionnaire    Fear of Current or Ex-Partner: No    Emotionally Abused: No    Physically Abused: No    Sexually Abused: No    Past Surgical History:  Procedure Laterality Date   BACK SURGERY     as a infant   LUMBAR LAMINECTOMY FOR TETHERED CORD RELEASE  2003   spinal cord surgery     as infant   tubes in ears     as a child    Family History  Problem Relation Age of Onset   Aneurysm Paternal Grandmother     No Known Allergies  Current Outpatient Medications on File Prior to Visit  Medication Sig Dispense Refill   amphetamine-dextroamphetamine (ADDERALL) 20 MG tablet Take 1 tablet (20 mg total) by mouth 2 (two) times daily. 60 tablet 0   clindamycin (CLEOCIN T) 1 % SWAB Use 1 pledgette topically every morning. (Patient not taking: Reported on 12/07/2022) 30 each 5   No current facility-administered medications on file prior to visit.    BP 120/80 (BP Location: Left Arm, Patient Position: Sitting, Cuff Size: Large)   Pulse 70   Temp (!) 97.5 F (36.4 C) (Oral)   Ht 6\' 2"  (1.88 m)   Wt 171 lb 6.4 oz (77.7 kg)   SpO2 98%   BMI 22.01 kg/m       Objective:   Physical Exam Vitals and nursing note reviewed.  Constitutional:      Appearance: Normal appearance.  Cardiovascular:     Rate and Rhythm: Normal rate and regular rhythm.     Pulses: Normal pulses.     Heart sounds: Normal heart sounds.  Pulmonary:     Effort: Pulmonary effort is normal.     Breath sounds: Normal breath sounds.  Neurological:     General: No focal deficit present.     Mental Status: He is alert and oriented to person, place, and time.  Psychiatric:        Mood and Affect: Mood normal.        Behavior: Behavior normal.        Thought  Content: Thought content normal.        Judgment: Judgment normal.       Assessment & Plan:  1. Elevated blood pressure reading in office without diagnosis of hypertension - at goal in the  office today. He is on adderall as well which can cause BP to increase. Advised to start eating healthy and exercising. He can monitor his BP periodically. Advised that ANA was likely a false positive and since his symptoms have resolved likely does not have an autoimmune issue. We discussed repeating this lab but decided that it was not worth it - Follow up with PCP as needed  2. Other fatigue - Resolved  3. Myalgia - Resolved  Shirline Frees, NP  Time spent with patient today was 31 minutes which consisted of chart review, discussing hypertension, fatigue and myalgia, work up, treatment answering questions and documentation.

## 2023-01-02 DIAGNOSIS — Z419 Encounter for procedure for purposes other than remedying health state, unspecified: Secondary | ICD-10-CM | POA: Diagnosis not present

## 2023-01-12 ENCOUNTER — Other Ambulatory Visit (HOSPITAL_BASED_OUTPATIENT_CLINIC_OR_DEPARTMENT_OTHER): Payer: Self-pay

## 2023-01-18 ENCOUNTER — Ambulatory Visit: Payer: Self-pay | Admitting: Family Medicine

## 2023-01-18 ENCOUNTER — Ambulatory Visit: Payer: BC Managed Care – PPO | Admitting: Family

## 2023-01-18 NOTE — Telephone Encounter (Signed)
  Chief Complaint: back pain Symptoms: lower back pain Frequency: yesterday Pertinent Negatives: Patient denies fever, numbness/tingling, injury to area Disposition: [] ED /[] Urgent Care (no appt availability in office) / [] Appointment(In office/virtual)/ [x]  [facility]/ [] Home Care/ [] Refused Recommended Disposition /[] [facility]/ []  Follow-up with PCP Additional Notes: Patient reports lower back pain after going to trampoline park yesterday. Claims he did not have injury, but did perform tricks that may have caused back injury. Scheduled patient per protocol on 01/21/2023. Patient verbalized understanding and to call back with worsening symptoms.    Copied from CRM [id number]. Topic: Clinical - Red Word Triage >> Jan 18, 2023  9:24 AM Irine Seal wrote: [facility] that prompted transfer to Nurse Triage: Lower lumbar pain, pt went to trampoline park and after doing splits and physical activity started to experience lower lumbar pain, pain has been present for 24-48 hours,  this has previously happened with weight lifting back in 10/24 was seen by sports medicine doctor for previous injury, pt is experiencing  difficulty getting up from seated position and putting on socks, once standing walking is not hindered- Hx of previous spinal cord surgery in infancy.  Pain rating  6 or 7 when extending minimal pain when standing Reason for Disposition  [1] MODERATE back pain (e.g., interferes with normal activities) AND [2] present > 3 days  Answer Assessment - Initial Assessment Questions 1. ONSET: "When did the pain begin?"      yesterday 2. LOCATION: "Where does it hurt?" (upper, mid or lower back)     Lower back 3. SEVERITY: "How bad is the pain?"  (e.g., Scale 1-10; mild, moderate, or severe)   - MILD (1-3): Doesn't interfere with normal activities.    - MODERATE (4-7): Interferes with normal activities or awakens from sleep.    - SEVERE (8-10): Excruciating pain, unable to do any  normal activities.      6-7/10 4. PATTERN: "Is the pain constant?" (e.g., yes, no; constant, intermittent)      Constant, esp with mobility. Woke up 4-5 times last night. Took tylenol 5. RADIATION: "Does the pain shoot into your legs or somewhere else?"     no 6. CAUSE:  "What do you think is causing the back pain?"      Trampoline park and previous lumbar injury 7. BACK OVERUSE:  "Any recent lifting of heavy objects, strenuous work or exercise?"     Deadlifting accident - lumbar injury and dx with slipped disc and went to pain medicine 8. MEDICINES: "What have you taken so far for the pain?" (e.g., nothing, acetaminophen, NSAIDS)     Tylenol with some relief 9. NEUROLOGIC SYMPTOMS: "Do you have any weakness, numbness, or problems with bowel/bladder control?"     denies 10. OTHER SYMPTOMS: "Do you have any other symptoms?" (e.g., fever, abdomen pain, burning with urination, blood in urine)       denies  Protocols used: Back Pain-A-AH

## 2023-01-21 ENCOUNTER — Ambulatory Visit: Payer: BC Managed Care – PPO | Admitting: Family Medicine

## 2023-02-02 DIAGNOSIS — Z419 Encounter for procedure for purposes other than remedying health state, unspecified: Secondary | ICD-10-CM | POA: Diagnosis not present

## 2023-02-13 ENCOUNTER — Ambulatory Visit: Payer: Self-pay | Admitting: Family Medicine

## 2023-02-13 ENCOUNTER — Ambulatory Visit: Payer: BC Managed Care – PPO | Admitting: Family Medicine

## 2023-02-13 ENCOUNTER — Ambulatory Visit (INDEPENDENT_AMBULATORY_CARE_PROVIDER_SITE_OTHER): Payer: Medicaid Other | Admitting: Family Medicine

## 2023-02-13 VITALS — BP 110/70 | HR 91 | Temp 97.8°F | Wt 161.6 lb

## 2023-02-13 DIAGNOSIS — R52 Pain, unspecified: Secondary | ICD-10-CM | POA: Diagnosis not present

## 2023-02-13 LAB — POCT INFLUENZA A/B
Influenza A, POC: NEGATIVE
Influenza B, POC: NEGATIVE

## 2023-02-13 LAB — POC COVID19 BINAXNOW: SARS Coronavirus 2 Ag: NEGATIVE

## 2023-02-13 NOTE — Progress Notes (Signed)
Established Patient Office Visit  Subjective   Patient ID: Clayton Avery, male    DOB: 2000-05-30  Age: 23 y.o. MRN: [id number]  Chief Complaint  Patient presents with   Generalized Body Aches   Headache    HPI   Clayton Avery is seen with onset yesterday of bodyaches, headache, nasal congestion, sneezing, mild cough.  No documented fever.  He had some increased malaise.  Denies any nausea, vomiting, or diarrhea.  He attends [employer] and did skip classes today because of his illness.  His main interest was in ruling out influenza  Past Medical History:  Diagnosis Date   ADHD (attention deficit hyperactivity disorder)    ADHD (attention deficit hyperactivity disorder)    Asthma    Migraine    Otitis    Tethered cord (HCC)    As a infant   Past Surgical History:  Procedure Laterality Date   BACK SURGERY     as a infant   LUMBAR LAMINECTOMY FOR TETHERED CORD RELEASE  2003   spinal cord surgery     as infant   tubes in ears     as a child    reports that he quit smoking about 2 years ago. His smoking use included cigarettes. He started smoking about 7 years ago. He has a 1.3 pack-year smoking history. He has never used smokeless tobacco. He reports current alcohol use. He reports that he does not currently use drugs. family history includes Aneurysm in his paternal grandmother. No Known Allergies] Review of Systems  Constitutional:  Negative for chills and fever.  HENT:  Positive for congestion.   Respiratory:  Positive for cough.   Cardiovascular:  Negative for chest pain.  Musculoskeletal:  Positive for myalgias.  Neurological:  Positive for headaches.      Objective:     BP 110/70 (BP Location: Left Arm, Patient Position: Sitting, Cuff Size: Normal)   Pulse 91   Temp 97.8 F (36.6 C) (Oral)   Wt 161 lb 9.6 oz (73.3 kg)   SpO2 99%   BMI 20.75 kg/m  BP Readings from Last 3 Encounters:  02/13/23 110/70  12/07/22 120/80  11/15/22 (!) 141/80   Wt Readings from  Last 3 Encounters:  02/13/23 161 lb 9.6 oz (73.3 kg)  12/07/22 171 lb 6.4 oz (77.7 kg)  11/15/22 165 lb 9.6 oz (75.1 kg)      Physical Exam Vitals reviewed.  Constitutional:      General: He is not in acute distress.    Appearance: He is not ill-appearing.  Cardiovascular:     Rate and Rhythm: Regular rhythm.  Pulmonary:     Effort: Pulmonary effort is normal.     Breath sounds: Normal breath sounds. No wheezing or rales.  Musculoskeletal:     Cervical back: Neck supple.  Lymphadenopathy:     Cervical: No cervical adenopathy.  Neurological:     Mental Status: He is alert.      Results for orders placed or performed in visit on 02/13/23  POC COVID-19  Result Value Ref Range   SARS Coronavirus 2 Ag Negative Negative  POC Influenza A/B  Result Value Ref Range   Influenza A, POC Negative Negative   Influenza B, POC Negative Negative      The ASCVD Risk score (Arnett DK, et al., 2019) failed to calculate for the following reasons:   The 2019 ASCVD risk score is only valid for ages 55 to 20    Assessment & Plan:  Problem List Items Addressed This Visit   None Visit Diagnoses       Body aches    -  Primary   Relevant Orders   POC COVID-19 (Completed)   POC Influenza A/B (Completed)     COVID and influenza testing negative.  Suspect other viral syndrome.  Patient nontoxic in appearance.  Lung exam clear.  Recommend plenty of fluids and rest.  Tylenol or Advil as needed for body aches and headaches.  Follow-up promptly for any persistent or worsening symptoms  No follow-ups on file.    Evelena Peat, MD

## 2023-02-13 NOTE — Telephone Encounter (Signed)
  Chief Complaint: body aches Symptoms: runny nose, body aches, diarrhea, feeling off Frequency: started last night Pertinent Negatives: Patient denies CP, SOB Disposition: [] ED /[] Urgent Care (no appt availability in office) / [x] Appointment(In office/virtual)/ []  [facility]/ [] Home Care/ [] Refused Recommended Disposition /[] [facility]/ []  Follow-up with PCP Additional Notes: patient called with complaints of runny nose, diarrhea, body aches and feeling off. Patient states symptoms started last night. Reports generally not feeling well currently. Patient reports taking Ibuprofen with no improvement in body aches. Per protocol, appointment is the recommendation. Appointment made for 02/13/2023 at 3:00 pm. Patient verbalized understanding of plan and all questions answered.    Summary: Not feeling well   Copied From CRM [id number]. Reason for Triage: Last night felt off, runny nose, body hurts, Diarrhea, chills     Reason for Disposition  [1] SEVERE pain (e.g., excruciating, unable to do any normal activities) AND [2] not improved 2 hours after pain medicine  Answer Assessment - Initial Assessment Questions 1. ONSET: "When did the muscle aches or body pains start?"      Started yesterday and got worse today 2. LOCATION: "What part of your body is hurting?" (e.g., entire body, arms, legs)      Entire body 3. SEVERITY: "How bad is the pain?" (Scale 1-10; or mild, moderate, severe)   - MILD (1-3): doesn't interfere with normal activities    - MODERATE (4-7): interferes with normal activities or awakens from sleep    - SEVERE (8-10):  excruciating pain, unable to do any normal activities      4-5 out of 10 4. CAUSE: "What do you think is causing the pains?"     unsure 5. FEVER: "Have you been having fever?"     Patient feels like he is running a temperature 6. OTHER SYMPTOMS: "Do you have any other symptoms?" (e.g., chest pain, weakness, rash, cold or flu symptoms,  weight loss)     Runny nose, chills, headache, diarrhea 8. TRAVEL: "Have you traveled out of the country in the last month?" (e.g., travel history, exposures)     No  Protocols used: Muscle Aches and Body Pain-A-AH

## 2023-02-13 NOTE — Patient Instructions (Signed)
Suspect viral syndrome  Influenza and Covid negative  Plenty of fluids and rest and OTC analgesics.

## 2023-03-02 DIAGNOSIS — Z419 Encounter for procedure for purposes other than remedying health state, unspecified: Secondary | ICD-10-CM | POA: Diagnosis not present

## 2023-03-27 ENCOUNTER — Other Ambulatory Visit: Payer: Self-pay | Admitting: Family Medicine

## 2023-03-27 ENCOUNTER — Other Ambulatory Visit (HOSPITAL_BASED_OUTPATIENT_CLINIC_OR_DEPARTMENT_OTHER): Payer: Self-pay

## 2023-03-27 DIAGNOSIS — F909 Attention-deficit hyperactivity disorder, unspecified type: Secondary | ICD-10-CM

## 2023-03-27 MED ORDER — AMPHETAMINE-DEXTROAMPHETAMINE 20 MG PO TABS
20.0000 mg | ORAL_TABLET | Freq: Two times a day (BID) | ORAL | 0 refills | Status: DC
  Filled 2023-03-27: qty 60, 30d supply, fill #0

## 2023-04-04 ENCOUNTER — Telehealth: Payer: Self-pay | Admitting: *Deleted

## 2023-04-04 ENCOUNTER — Encounter: Payer: Self-pay | Admitting: Family Medicine

## 2023-04-04 ENCOUNTER — Ambulatory Visit (INDEPENDENT_AMBULATORY_CARE_PROVIDER_SITE_OTHER): Admitting: Family Medicine

## 2023-04-04 VITALS — BP 124/80 | HR 100 | Temp 97.8°F | Ht 74.0 in | Wt 161.0 lb

## 2023-04-04 DIAGNOSIS — F909 Attention-deficit hyperactivity disorder, unspecified type: Secondary | ICD-10-CM | POA: Diagnosis not present

## 2023-04-04 DIAGNOSIS — Z79899 Other long term (current) drug therapy: Secondary | ICD-10-CM | POA: Diagnosis not present

## 2023-04-04 DIAGNOSIS — B353 Tinea pedis: Secondary | ICD-10-CM

## 2023-04-04 NOTE — Patient Instructions (Addendum)
-  Continue Adderall. Urine drug screen obtained today.  -Ordered lab to evaluate liver function. If liver function is stable, will prescribe Terbinafine (Lamasil) 250mg  tablet daily for 3 months. If not improved, will need to follow up with dermatology.  -Provided information about [facility].

## 2023-04-04 NOTE — Telephone Encounter (Signed)
 noted

## 2023-04-04 NOTE — Assessment & Plan Note (Signed)
 Stable. Continue Adderall 20mg  BID. UDS obtained. Controlled substance contract is UTD. No refills needed.

## 2023-04-04 NOTE — Telephone Encounter (Signed)
 Copied from CRM [id number]. Topic: General - Running Late >> Apr 04, 2023  3:26 PM [place] J wrote: Patient/patient representative is calling because they are running late for an appointment.

## 2023-04-04 NOTE — Progress Notes (Signed)
   Established Patient Office Visit   Subjective:  Patient ID: Clayton Avery, male    DOB: 11-06-2000  Age: 23 y.o. MRN: [id number]  Chief Complaint  Patient presents with   Medication Management    HPI ADHD: Chronic. Patient is taking Adderall 20mg  BID daily. He reports the medication is effective and helping through the whole day. He reports he has been taking during the week, during school and not on the weekends.   He reports he has foot fungus and it also on his nails. He reports he has tried several things over the counter and it has not been effective.  ROS See HPI above     Objective:   BP 124/80   Pulse 100   Temp 97.8 F (36.6 C) (Oral)   Ht 6\' 2"  (1.88 m)   Wt 161 lb (73 kg)   SpO2 99%   BMI 20.67 kg/m    Physical Exam Vitals reviewed.  Constitutional:      General: He is not in acute distress.    Appearance: Normal appearance. He is not ill-appearing, toxic-appearing or diaphoretic.  Eyes:     General:        Right eye: No discharge.        Left eye: No discharge.     Conjunctiva/sclera: Conjunctivae normal.  Cardiovascular:     Rate and Rhythm: Normal rate and regular rhythm.     Heart sounds: Normal heart sounds. No murmur heard.    No friction rub. No gallop.  Pulmonary:     Effort: Pulmonary effort is normal. No respiratory distress.     Breath sounds: Normal breath sounds.  Musculoskeletal:        General: Normal range of motion.  Feet:     Right foot:     Toenail Condition: Fungal disease present.    Left foot:     Toenail Condition: Fungal disease present.    Comments: Feet are wet, with flaky skin  Skin:    General: Skin is warm and dry.  Neurological:     General: No focal deficit present.     Mental Status: He is alert and oriented to person, place, and time. Mental status is at baseline.  Psychiatric:        Mood and Affect: Mood normal.        Behavior: Behavior normal.        Thought Content: Thought content normal.         Judgment: Judgment normal.      Assessment & Plan:  Attention deficit hyperactivity disorder (ADHD), unspecified ADHD type Assessment & Plan: Stable. Continue Adderall 20mg  BID. UDS obtained. Controlled substance contract is UTD. No refills needed.   Orders: -     DRUG MONITOR, PANEL 1, SCREEN, URINE  Tinea pedis of both feet -     Comprehensive metabolic panel with GFR  Medication management -     DRUG MONITOR, PANEL 1, SCREEN, URINE -     Comprehensive metabolic panel with GFR  -Ordered CMP to evaluate liver function. If liver function is stable, will prescribe Terbinafine (Lamasil) 250mg  tablet daily for 3 months. If not improved, will need to follow up with dermatology.  -Provided information about [facility].   Return in about 3 months (around 07/04/2023) for chronic management.   Zandra Abts, NP

## 2023-04-05 ENCOUNTER — Encounter: Payer: Self-pay | Admitting: Family Medicine

## 2023-04-05 ENCOUNTER — Other Ambulatory Visit (HOSPITAL_BASED_OUTPATIENT_CLINIC_OR_DEPARTMENT_OTHER): Payer: Self-pay

## 2023-04-05 LAB — DRUG MONITOR, PANEL 1, SCREEN, URINE
Amphetamines: POSITIVE ng/mL — AB (ref <500)
Barbiturates: NEGATIVE ng/mL (ref <300)
Benzodiazepines: NEGATIVE ng/mL (ref <100)
Cocaine Metabolite: NEGATIVE ng/mL (ref <150)
Creatinine: 180.1 mg/dL (ref >=20.0)
Marijuana Metabolite: NEGATIVE ng/mL (ref <20)
Methadone Metabolite: NEGATIVE ng/mL (ref <100)
Opiates: NEGATIVE ng/mL (ref <100)
Oxidant: NEGATIVE ug/mL (ref <200)
Oxycodone: NEGATIVE ng/mL (ref <100)
Phencyclidine: NEGATIVE ng/mL (ref <25)
pH: 7.5 (ref 4.5–9.0)

## 2023-04-05 LAB — COMPREHENSIVE METABOLIC PANEL WITH GFR
ALT: 16 U/L (ref 0–53)
AST: 16 U/L (ref 0–37)
Albumin: 4.9 g/dL (ref 3.5–5.2)
Alkaline Phosphatase: 38 U/L — ABNORMAL LOW (ref 39–117)
BUN: 17 mg/dL (ref 6–23)
CO2: 28 meq/L (ref 19–32)
Calcium: 9.7 mg/dL (ref 8.4–10.5)
Chloride: 100 meq/L (ref 96–112)
Creatinine, Ser: 1.07 mg/dL (ref 0.40–1.50)
GFR: 98.71 mL/min (ref >60.00)
Glucose, Bld: 86 mg/dL (ref 70–99)
Potassium: 3.8 meq/L (ref 3.5–5.1)
Sodium: 137 meq/L (ref 135–145)
Total Bilirubin: 0.8 mg/dL (ref 0.2–1.2)
Total Protein: 7.9 g/dL (ref 6.0–8.3)

## 2023-04-05 LAB — DM TEMPLATE

## 2023-04-05 MED ORDER — TERBINAFINE HCL 250 MG PO TABS
250.0000 mg | ORAL_TABLET | Freq: Every day | ORAL | 0 refills | Status: DC
  Filled 2023-04-05: qty 90, 90d supply, fill #0

## 2023-04-05 NOTE — Addendum Note (Signed)
 Addended by: Kenna Gilbert B on: 04/05/2023 02:15 PM   Modules accepted: Orders

## 2023-04-10 ENCOUNTER — Ambulatory Visit (INDEPENDENT_AMBULATORY_CARE_PROVIDER_SITE_OTHER): Admitting: Sports Medicine

## 2023-04-10 ENCOUNTER — Encounter: Payer: Self-pay | Admitting: Sports Medicine

## 2023-04-10 VITALS — BP 122/70 | Ht 74.0 in | Wt 161.0 lb

## 2023-04-10 DIAGNOSIS — G5622 Lesion of ulnar nerve, left upper limb: Secondary | ICD-10-CM | POA: Diagnosis not present

## 2023-04-10 NOTE — Progress Notes (Signed)
   Subjective:    Patient ID: Clayton Avery, male    DOB: 2000/09/29, 23 y.o.   MRN: [id number]  HPI chief complaint: Left elbow pain  Clayton Avery is a very pleasant 23 year old male that presents today with left elbow pain.  He has a history of cubital tunnel syndrome treated with Hydro dissection 2 years ago with excellent results.  The symptoms have begun to return.  He gets numbness and tingling into the forearm and hand.  Pain can be quite excruciating.  He is interested in repeat hydrodissection.   Review of Systems As above    Objective:   Physical Exam  Well-developed, well-nourished.  No acute distress  Left elbow: Full range of motion.  No soft tissue swelling.  No appreciable subluxation of the ulnar nerve.  Positive Tinel's at the cubital tunnel.  No atrophy.  Good strength.      Assessment & Plan:   Left elbow cubital tunnel syndrome  [facility]erral is made to [facility] for [facility] of ulnar nerve hydrodissection.  If symptoms persist thereafter that he may need an ulnar nerve transposition.  Follow-up with me as needed.  This note was dictated using Dragon naturally speaking software and may contain errors in syntax, spelling, or content which have not been identified prior to signing this note.

## 2023-04-12 ENCOUNTER — Ambulatory Visit: Admitting: Sports Medicine

## 2023-04-12 ENCOUNTER — Other Ambulatory Visit (HOSPITAL_BASED_OUTPATIENT_CLINIC_OR_DEPARTMENT_OTHER): Payer: Self-pay

## 2023-04-12 ENCOUNTER — Other Ambulatory Visit: Payer: Self-pay

## 2023-04-12 ENCOUNTER — Encounter: Payer: Self-pay | Admitting: Sports Medicine

## 2023-04-12 DIAGNOSIS — M25522 Pain in left elbow: Secondary | ICD-10-CM

## 2023-04-12 DIAGNOSIS — G5622 Lesion of ulnar nerve, left upper limb: Secondary | ICD-10-CM | POA: Diagnosis not present

## 2023-04-12 MED ORDER — MELOXICAM 15 MG PO TABS
15.0000 mg | ORAL_TABLET | Freq: Every day | ORAL | 0 refills | Status: DC
  Filled 2023-04-12: qty 21, 21d supply, fill #0

## 2023-04-12 MED ORDER — BETAMETHASONE SOD PHOS & ACET 6 (3-3) MG/ML IJ SUSP
6.0000 mg | INTRAMUSCULAR | Status: AC | PRN
  Administered 2023-04-12: 6 mg via INTRA_ARTICULAR

## 2023-04-12 MED ORDER — BUPIVACAINE HCL 0.25 % IJ SOLN
2.0000 mL | INTRAMUSCULAR | Status: AC | PRN
  Administered 2023-04-12: 2 mL via INTRA_ARTICULAR

## 2023-04-12 NOTE — Progress Notes (Signed)
 Clayton Avery - 23 y.o. male MRN [id number]  Date of birth: [birth year]  Office Visit Note: Visit Date: 04/12/2023 PCP: Alveria Apley, NP Referred by: Alveria Apley, NP  Subjective: Chief Complaint  Patient presents with   Left Elbow - Pain   HPI: Clayton Avery is a very pleasant 23 y.o. male who presents today for acute on chronic left elbow pain with ulnar neuritis.  Clayton Avery has been having left elbow pain with burning/tingling in the ulnar nerve distribution.  He does have a history of cubital tunnel syndrome and did receive about 2 years of relief after an ulnar nerve hydrodissection injection.  His pain has returned and worsened over the last 3 weeks.  He is alternating Tylenol and ibuprofen.  He did purchase a brace to sleep with the elbow in extension.  He is right-hand dominant.  Pertinent ROS were reviewed with the patient and found to be negative unless otherwise specified above in HPI.   Assessment & Plan: Visit Diagnoses:  1. Cubital tunnel syndrome on left   2. Pain in left elbow   3. Neuritis of left ulnar nerve    Plan: Impression is left elbow pain with evidence of cubital tunnel as well as ulnar nerve subluxation.  We did discuss the nature of this condition and available treatment options.  He did receive relief from hydrodissection injection in the past for about 2 years, we did repeat this injection today under US-guidance just proximal to the cubital tunnel near the region of his restriction.  Under dynamic ultrasound view he does have evidence of ulnar nerve subluxation which is symptomatic.  Discussed with him ulnar nerve flossing/nerve gliding exercises to perform at home.  He will sleep with the elbow in a extended position, he does have a brace.  I will place him on a short course of meloxicam 15 mg to be taken once daily for 2 weeks.  He will notify me in about 2 weeks the degree of relief he receives.  Hopefully he gets full relief, if  he does not however we did discuss he could be a candidate for ulnar nerve transposition surgery.  Follow-up: Return for can message/update me in 2 weeks .   Meds & Orders:  Meds ordered this encounter  Medications   meloxicam (MOBIC) 15 MG tablet    Sig: Take 1 tablet (15 mg total) by mouth daily.    Dispense:  21 tablet    Refill:  0    Orders Placed This Encounter  Procedures   Medium Joint Inj: R elbow   US Guided Needle Placement - No Linked Charges     Procedures: Medium Joint Inj: R elbow on 04/12/2023 3:34 PM Indications: pain and diagnostic evaluation Details: 22 G 1.5 in needle, ultrasound-guided anterior approach Medications: 2 mL bupivacaine 0.25 %; 6 mg betamethasone acetate-betamethasone sodium phosphate 6 (3-3) MG/ML (1 D5W, 2 NS)  US-guided left elbow injection, ulnar nerve hydrodissection injection proximal cubital tunnel  *Anterior approach was performed with needle guidance via an in-plane approach from a medial to lateral direction.  Needle 22-gauge, 1.5 inch with injectate as above with dynamic spread and 360 degree hydrodissection around the ulnar nerve. Procedure, treatment alternatives, risks and benefits explained, specific risks discussed. Consent was given by the patient. Patient was prepped and draped in the usual sterile fashion.          Clinical History: No specialty comments available.  He reports that he quit smoking about 2  years ago. His smoking use included cigarettes. He started smoking about 7 years ago. He has a 1.3 pack-year smoking history. He has never used smokeless tobacco. No results for input(s): "HGBA1C", "LABURIC" in the last 8760 hours.  Objective:    Physical Exam  Gen: Well-appearing, in no acute distress; non-toxic CV: Well-perfused. Warm.  Resp: Breathing unlabored on room air; no wheezing. Psych: Fluid speech in conversation; appropriate affect; normal thought process  Ortho Exam - Left elbow: + Tinel's at the cubital  tunnel as well as both proximal and distal.  There is reproducible subluxation with endrange flexion about the elbow.  No redness swelling or effusion.  No bony tenderness.  Imaging: US Guided Needle Placement - No Linked Charges Result Date: 04/12/2023 Evidence of ulnar neuritis.  There is dynamic evidence of ulnar nerve subluxation popping out of the groove/over medial epicondyle with endrange flexion. *Procedurally successful left elbow injection with ulnar nerve hydrodissection    *Static Scan - Ulnar nerve swollen fasicles   *Dynamic scan w/elbow in extension   *Dynamic scan w/elbow in flexion (nerve subluxed out of groove/over medial epicondyle).  Past Medical/Family/Surgical/Social History: Medications & Allergies reviewed per EMR, new medications updated. Patient Active Problem List   Diagnosis Date Noted   Attention deficit hyperactivity disorder (ADHD) 08/22/2022   Acne 08/22/2022   Alcohol use 10/26/2020   Generalized anxiety disorder 10/26/2020   Mood disorder (HCC) 10/26/2020   Past Medical History:  Diagnosis Date   ADHD (attention deficit hyperactivity disorder)    ADHD (attention deficit hyperactivity disorder)    Asthma    Migraine    Otitis    Tethered cord (HCC)    As a infant   Family History  Problem Relation Age of Onset   Aneurysm Paternal Grandmother    Past Surgical History:  Procedure Laterality Date   BACK SURGERY     as a infant   LUMBAR LAMINECTOMY FOR TETHERED CORD RELEASE  2003   spinal cord surgery     as infant   tubes in ears     as a child   Social History   Occupational History   Not on file  Tobacco Use   Smoking status: Former    Current packs/day: 0.00    Average packs/day: 0.3 packs/day for 5.0 years (1.3 ttl pk-yrs)    Types: Cigarettes    Start date: 01/02/2016    Quit date: 01/01/2021    Years since quitting: 2.2   Smokeless tobacco: Never  Vaping Use   Vaping status: Never Used  Substance and Sexual Activity    Alcohol use: Yes    Comment: 1x month 2-3 drinks   Drug use: Not Currently   Sexual activity: Yes

## 2023-04-12 NOTE — Progress Notes (Signed)
 Patient says that he did have an injection for the same thing in the left elbow a couple of years ago and got good relief until his pain flared back up about 3 weeks ago. He alternates Tylenol and Ibuprofen, but has not taken anything today. He is right handed.

## 2023-04-13 DIAGNOSIS — Z419 Encounter for procedure for purposes other than remedying health state, unspecified: Secondary | ICD-10-CM | POA: Diagnosis not present

## 2023-04-15 ENCOUNTER — Other Ambulatory Visit (HOSPITAL_BASED_OUTPATIENT_CLINIC_OR_DEPARTMENT_OTHER): Payer: Self-pay

## 2023-04-15 ENCOUNTER — Other Ambulatory Visit: Payer: Self-pay | Admitting: Sports Medicine

## 2023-04-15 ENCOUNTER — Encounter: Payer: Self-pay | Admitting: Sports Medicine

## 2023-04-15 MED ORDER — METHYLPREDNISOLONE 4 MG PO TBPK
ORAL_TABLET | ORAL | 0 refills | Status: DC
  Filled 2023-04-15: qty 21, 6d supply, fill #0

## 2023-04-17 ENCOUNTER — Ambulatory Visit: Admitting: Sports Medicine

## 2023-05-09 ENCOUNTER — Ambulatory Visit
Admission: RE | Admit: 2023-05-09 | Discharge: 2023-05-09 | Disposition: A | Discharge status: Home / Self Care | Source: Ambulatory Visit | Attending: Family Medicine | Admitting: Family Medicine

## 2023-05-09 ENCOUNTER — Other Ambulatory Visit (HOSPITAL_BASED_OUTPATIENT_CLINIC_OR_DEPARTMENT_OTHER): Payer: Self-pay

## 2023-05-09 VITALS — BP 138/86 | HR 102 | Temp 99.7°F | Resp 16 | Ht 73.0 in | Wt 170.0 lb

## 2023-05-09 DIAGNOSIS — K120 Recurrent oral aphthae: Secondary | ICD-10-CM

## 2023-05-09 LAB — POC COVID19/FLU A&B COMBO
Covid Antigen, POC: NEGATIVE
Influenza A Antigen, POC: NEGATIVE
Influenza B Antigen, POC: NEGATIVE

## 2023-05-09 MED ORDER — LIDOCAINE VISCOUS HCL 2 % MT SOLN
10.0000 mL | Freq: Three times a day (TID) | OROMUCOSAL | 0 refills | Status: DC | PRN
  Filled 2023-05-09: qty 120, 4d supply, fill #0

## 2023-05-09 NOTE — ED Provider Notes (Signed)
 [facility]    CSN: [id number] Arrival date & time: 05/09/23  1637      History   Chief Complaint Chief Complaint  Patient presents with   Mouth Lesions    headache, mouth ulcers - Entered by patient    HPI Clayton Avery is a 23 y.o. male Clayton Avery with oral ulcers.  Patient reports about 3 days of several painful white ulcers under his tongue, on his inner cheeks, inside of his lip.  He does report he has had some bodyaches, headache, chills but denies any fevers, neck pain, cough, congestion, sore throat, shortness of breath, abdominal pain, diarrhea.  Denies any new foods or medications.  Has been taking Tylenol  and Orajel and cough drops with some relief.  Denies any rashes on body or hands or feet.  No history of HIV.  No other concerns at this time.   Mouth Lesions   Past Medical History:  Diagnosis Date   ADHD (attention deficit hyperactivity disorder)    ADHD (attention deficit hyperactivity disorder)    Asthma    Migraine    Otitis    Tethered cord (HCC)    As a infant    Patient Active Problem List   Diagnosis Date Noted   Attention deficit hyperactivity disorder (ADHD) 08/22/2022   Acne 08/22/2022   Alcohol use 10/26/2020   Generalized anxiety disorder 10/26/2020   Mood disorder (HCC) 10/26/2020    Past Surgical History:  Procedure Laterality Date   BACK SURGERY     as a infant   LUMBAR LAMINECTOMY FOR TETHERED CORD RELEASE  2003   spinal cord surgery     as infant   tubes in ears     as a child       Home Medications    Prior to Admission medications   Medication Sig Start Date End Date Taking? Authorizing Provider  magic mouthwash (lidocaine, diphenhydrAMINE , alum & mag hydroxide) suspension Swish and spit 10 mLs 3 (three) times daily as needed for mouth pain. 05/09/23  Yes Lawyer Washabaugh, Jodi R, NP  amphetamine -dextroamphetamine  (ADDERALL) 20 MG tablet Take 1 tablet (20 mg total) by mouth 2 (two) times daily. 03/27/23   Francenia Ingle, NP  terbinafine  (LAMISIL ) 250 MG tablet Take 1 tablet (250 mg total) by mouth daily. 04/05/23   Francenia Ingle, NP    Family History Family History  Problem Relation Age of Onset   Aneurysm Paternal Grandmother     Social History Social History   Tobacco Use   Smoking status: Former    Current packs/day: 0.00    Average packs/day: 0.3 packs/day for 5.0 years (1.3 ttl pk-yrs)    Types: Cigarettes    Start date: 01/02/2016    Quit date: 01/01/2021    Years since quitting: 2.3   Smokeless tobacco: Never  Vaping Use   Vaping status: Never Used  Substance Use Topics   Alcohol use: Yes    Comment: 1x month 2-3 drinks   Drug use: Not Currently     Allergies   Patient has no known allergies.   Review of Systems Review of Systems  Constitutional:  Positive for chills.  HENT:  Positive for mouth sores.   Neurological:  Positive for headaches.     Physical Exam Triage Vital Signs ED Triage Vitals  Encounter Vitals Group     BP 05/09/23 1701 138/86     Systolic BP Percentile --      Diastolic BP Percentile --  Pulse Rate 05/09/23 1701 (!) 102     Resp 05/09/23 1701 16     Temp 05/09/23 1701 99.7 F (37.6 C)     Temp Source 05/09/23 1701 Oral     SpO2 05/09/23 1701 98 %     Weight 05/09/23 1701 170 lb (77.1 kg)     Height 05/09/23 1701 6\' 1"  (1.854 m)     Head Circumference --      Peak Flow --      Pain Score 05/09/23 1658 7     Pain Loc --      Pain Education --      Exclude from Growth Chart --    No data found.  Updated Vital Signs BP 138/86 (BP Location: Right Arm)   Pulse (!) 102   Temp 99.7 F (37.6 C) (Oral)   Resp 16   Ht 6\' 1"  (1.854 m)   Wt 170 lb (77.1 kg)   SpO2 98%   BMI 22.43 kg/m   Visual Acuity Right Eye Distance:   Left Eye Distance:   Bilateral Distance:    Right Eye Near:   Left Eye Near:    Bilateral Near:     Physical Exam Vitals and nursing note reviewed.  Constitutional:      General: He is not in acute  distress.    Appearance: Normal appearance. He is not ill-appearing.  HENT:     Head: Normocephalic and atraumatic.     Mouth/Throat:     Lips: Pink.     Mouth: Mucous membranes are moist. No angioedema.     Tongue: Tongue does not deviate from midline.     Pharynx: Oropharynx is clear. Uvula midline. No pharyngeal swelling, posterior oropharyngeal erythema or uvula swelling.     Comments: Several aphthous  ulcers to sides of cheeks, 1 under the tongue and 1 on the inside of the lip.  No swelling.  Airway is patent with midline uvula. Eyes:     Pupils: Pupils are equal, round, and reactive to light.  Neck:     Meningeal: Brudzinski's sign absent.  Cardiovascular:     Rate and Rhythm: Regular rhythm. Tachycardia present.     Heart sounds: Normal heart sounds.     Comments: Heart rate 102 in setting of fever Pulmonary:     Effort: Pulmonary effort is normal.     Breath sounds: Normal breath sounds.  Musculoskeletal:     Cervical back: Full passive range of motion without pain, normal range of motion and neck supple. No edema, erythema, signs of trauma, rigidity, torticollis or crepitus. No pain with movement, spinous process tenderness or muscular tenderness. Normal range of motion.  Lymphadenopathy:     Cervical: No cervical adenopathy.  Skin:    General: Skin is warm and dry.     Findings: No rash.  Neurological:     General: No focal deficit present.     Mental Status: He is alert and oriented to person, place, and time.     GCS: GCS eye subscore is 4. GCS verbal subscore is 5. GCS motor subscore is 6.  Psychiatric:        Mood and Affect: Mood normal.        Behavior: Behavior normal.      UC Treatments / Results  Labs (all labs ordered are listed, but only abnormal results are displayed) Labs Reviewed  POC COVID19/FLU A&B COMBO    EKG   Radiology No results found.  Procedures Procedures (  including critical care time)  Medications Ordered in UC Medications -  No data to display  Initial Impression / Assessment and Plan / UC Course  I have reviewed the triage vital signs and the nursing notes.  Pertinent labs & imaging results that were available during my care of the patient were reviewed by me and considered in my medical decision making (see chart for details).     Reviewed exam and symptoms with patient.  No red flags.  Negative COVID and flu testing.  Discussed aphthous stomatitis.  He is well-appearing.  No signs of meningitis on exam.  Reviewed case with Dr. Percilla Boys.  Likely viral illness.  Will do symptomatic treatment with Magic mouthwash and he can continue over-the-counter analgesics as needed.  Advised to see PCP in 2 days for recheck.  Strict ER precautions reviewed and patient verbalized understanding. Final Clinical Impressions(s) / UC Diagnoses   Final diagnoses:  Aphthous stomatitis     Discharge Instructions      You have tested negative for COVID and flu.  You may use the Magic mouthwash 3 times a day to help with your mouth pain.  Continue Tylenol  ibuprofen  as needed.  Please follow-up with your PCP in 2 days for recheck.  Please go to the ER if you develop any worsening symptoms which include but are not limited to a fever you cannot manage, vomiting, severe neck pain or headache, or any new concerns that arise.  I hope you feel better soon!   ED Prescriptions     Medication Sig Dispense Auth. Provider   magic mouthwash (lidocaine, diphenhydrAMINE , alum & mag hydroxide) suspension Swish and spit 10 mLs 3 (three) times daily as needed for mouth pain. 118 mL Kaesha Kirsch, Jodi R, NP      PDMP not reviewed this encounter.   Alleen Arbour, NP 05/09/23 [phone]

## 2023-05-09 NOTE — Discharge Instructions (Addendum)
 You have tested negative for COVID and flu.  You may use the Magic mouthwash 3 times a day to help with your mouth pain.  Continue Tylenol  ibuprofen  as needed.  Please follow-up with your PCP in 2 days for recheck.  Please go to the ER if you develop any worsening symptoms which include but are not limited to a fever you cannot manage, vomiting, severe neck pain or headache, or any new concerns that arise.  I hope you feel better soon!

## 2023-05-09 NOTE — ED Triage Notes (Signed)
 Patient here today with c/o mouth sores X 3 days. Patient states that he has not been able to eat. He has been having a headache and neck pain as well. He has had chills for the past 2 nights. He has been taking Tylenol  with some relief. Cough drops and oral gel help some.

## 2023-05-10 ENCOUNTER — Other Ambulatory Visit (HOSPITAL_BASED_OUTPATIENT_CLINIC_OR_DEPARTMENT_OTHER): Payer: Self-pay

## 2023-05-13 DIAGNOSIS — Z419 Encounter for procedure for purposes other than remedying health state, unspecified: Secondary | ICD-10-CM | POA: Diagnosis not present

## 2023-05-28 ENCOUNTER — Other Ambulatory Visit (HOSPITAL_BASED_OUTPATIENT_CLINIC_OR_DEPARTMENT_OTHER): Payer: Self-pay

## 2023-05-28 ENCOUNTER — Other Ambulatory Visit: Payer: Self-pay | Admitting: Sports Medicine

## 2023-05-28 MED ORDER — MELOXICAM 15 MG PO TABS
15.0000 mg | ORAL_TABLET | Freq: Every day | ORAL | 0 refills | Status: DC
  Filled 2023-05-28: qty 30, 30d supply, fill #0

## 2023-06-07 ENCOUNTER — Other Ambulatory Visit (HOSPITAL_BASED_OUTPATIENT_CLINIC_OR_DEPARTMENT_OTHER): Payer: Self-pay

## 2023-06-13 DIAGNOSIS — Z419 Encounter for procedure for purposes other than remedying health state, unspecified: Secondary | ICD-10-CM | POA: Diagnosis not present

## 2023-07-01 ENCOUNTER — Other Ambulatory Visit (HOSPITAL_BASED_OUTPATIENT_CLINIC_OR_DEPARTMENT_OTHER): Payer: Self-pay

## 2023-07-01 ENCOUNTER — Other Ambulatory Visit: Payer: Self-pay | Admitting: Family Medicine

## 2023-07-01 DIAGNOSIS — F909 Attention-deficit hyperactivity disorder, unspecified type: Secondary | ICD-10-CM

## 2023-07-02 ENCOUNTER — Other Ambulatory Visit (HOSPITAL_BASED_OUTPATIENT_CLINIC_OR_DEPARTMENT_OTHER): Payer: Self-pay

## 2023-07-02 MED ORDER — AMPHETAMINE-DEXTROAMPHETAMINE 20 MG PO TABS
20.0000 mg | ORAL_TABLET | Freq: Two times a day (BID) | ORAL | 0 refills | Status: DC
  Filled 2023-07-02: qty 60, 30d supply, fill #0

## 2023-07-04 ENCOUNTER — Ambulatory Visit: Admitting: Family Medicine

## 2023-07-13 DIAGNOSIS — Z419 Encounter for procedure for purposes other than remedying health state, unspecified: Secondary | ICD-10-CM | POA: Diagnosis not present

## 2023-07-15 ENCOUNTER — Ambulatory Visit: Admitting: Family Medicine

## 2023-08-01 ENCOUNTER — Ambulatory Visit: Admitting: Family Medicine

## 2023-08-13 DIAGNOSIS — Z419 Encounter for procedure for purposes other than remedying health state, unspecified: Secondary | ICD-10-CM | POA: Diagnosis not present

## 2023-08-14 ENCOUNTER — Other Ambulatory Visit (HOSPITAL_BASED_OUTPATIENT_CLINIC_OR_DEPARTMENT_OTHER): Payer: Self-pay

## 2023-08-14 ENCOUNTER — Ambulatory Visit: Admitting: Family Medicine

## 2023-08-14 ENCOUNTER — Other Ambulatory Visit: Payer: Self-pay

## 2023-08-14 VITALS — BP 116/84 | HR 76 | Temp 98.0°F | Ht 73.0 in | Wt 158.0 lb

## 2023-08-14 DIAGNOSIS — F909 Attention-deficit hyperactivity disorder, unspecified type: Secondary | ICD-10-CM

## 2023-08-14 MED ORDER — AMPHETAMINE-DEXTROAMPHETAMINE 20 MG PO TABS
20.0000 mg | ORAL_TABLET | Freq: Two times a day (BID) | ORAL | 0 refills | Status: DC
  Filled 2023-08-14: qty 60, 30d supply, fill #0

## 2023-08-14 NOTE — Progress Notes (Signed)
   Established Patient Office Visit   Subjective:  Patient ID: Clayton Avery, male    DOB: 2000-12-17  Age: 23 y.o. MRN: [id number]  Chief Complaint  Patient presents with   Medical Management of Chronic Issues    3 month follow up     HPI ADHD: Chronic. Patient is taking Adderall 20mg  BID daily. He reports the medication is effective and helping through the whole day. He reports he has been taking during the week, during school, and not on the weekends.    ROS See HPI above     Objective:   BP 116/84   Pulse 76   Temp 98 F (36.7 C) (Oral)   Ht 6' 1 (1.854 m)   Wt 158 lb (71.7 kg)   SpO2 98%   BMI 20.85 kg/m    Physical Exam Vitals reviewed.  Constitutional:      General: He is not in acute distress.    Appearance: Normal appearance. He is normal weight. He is not ill-appearing, toxic-appearing or diaphoretic.  HENT:     Head: Normocephalic and atraumatic.  Eyes:     General:        Right eye: No discharge.        Left eye: No discharge.     Conjunctiva/sclera: Conjunctivae normal.  Cardiovascular:     Rate and Rhythm: Normal rate and regular rhythm.     Heart sounds: Normal heart sounds. No murmur heard.    No friction rub. No gallop.  Pulmonary:     Effort: Pulmonary effort is normal. No respiratory distress.     Breath sounds: Normal breath sounds.  Musculoskeletal:        General: Normal range of motion.  Skin:    General: Skin is warm and dry.  Neurological:     General: No focal deficit present.     Mental Status: He is alert and oriented to person, place, and time. Mental status is at baseline.  Psychiatric:        Mood and Affect: Mood normal.        Behavior: Behavior normal.        Thought Content: Thought content normal.        Judgment: Judgment normal.      Assessment & Plan:  Attention deficit hyperactivity disorder (ADHD), unspecified ADHD type Assessment & Plan: Stable. Ordered urine drug screen for management of Adderall.  Renewed controlled substance agreement. PDMP reviewed. Refilled on 07/02/2023. Refilled Adderall 20mg  tablet BID daily.   Orders: -     DRUG MONITOR, PANEL 1, SCREEN, URINE -     Amphetamine -Dextroamphetamine ; Take 1 tablet (20 mg total) by mouth 2 (two) times daily.  Dispense: 60 tablet; Refill: 0    Return in about 3 months (around 11/14/2023) for physical.   Guido Comp, NP

## 2023-08-14 NOTE — Assessment & Plan Note (Signed)
 Stable. Ordered urine drug screen for management of Adderall. Renewed controlled substance agreement. PDMP reviewed. Refilled on 07/02/2023. Refilled Adderall 20mg  tablet BID daily.

## 2023-08-14 NOTE — Patient Instructions (Addendum)
-  It was great to see you today. -Ordered urine drug screen for management of Adderall. Renewed controlled substance agreement. Refilled Adderall 20mg  tablet twice daily.  -Follow up in 3 months for physical.

## 2023-08-15 ENCOUNTER — Ambulatory Visit: Payer: Self-pay | Admitting: Family Medicine

## 2023-08-15 LAB — DRUG MONITOR, PANEL 1, SCREEN, URINE
Amphetamines: POSITIVE ng/mL — AB (ref <500)
Barbiturates: NEGATIVE ng/mL (ref <300)
Benzodiazepines: NEGATIVE ng/mL (ref <100)
Cocaine Metabolite: NEGATIVE ng/mL (ref <150)
Creatinine: 121.7 mg/dL (ref >=20.0)
Marijuana Metabolite: NEGATIVE ng/mL (ref <20)
Methadone Metabolite: NEGATIVE ng/mL (ref <100)
Opiates: NEGATIVE ng/mL (ref <100)
Oxidant: NEGATIVE ug/mL (ref <200)
Oxycodone: NEGATIVE ng/mL (ref <100)
Phencyclidine: NEGATIVE ng/mL (ref <25)
pH: 7.2 (ref 4.5–9.0)

## 2023-08-15 LAB — DM TEMPLATE

## 2023-09-13 DIAGNOSIS — Z419 Encounter for procedure for purposes other than remedying health state, unspecified: Secondary | ICD-10-CM | POA: Diagnosis not present

## 2023-09-23 ENCOUNTER — Other Ambulatory Visit: Payer: Self-pay | Admitting: Family Medicine

## 2023-09-23 ENCOUNTER — Other Ambulatory Visit (HOSPITAL_BASED_OUTPATIENT_CLINIC_OR_DEPARTMENT_OTHER): Payer: Self-pay

## 2023-09-23 DIAGNOSIS — F909 Attention-deficit hyperactivity disorder, unspecified type: Secondary | ICD-10-CM

## 2023-09-23 MED ORDER — AMPHETAMINE-DEXTROAMPHETAMINE 20 MG PO TABS
20.0000 mg | ORAL_TABLET | Freq: Two times a day (BID) | ORAL | 0 refills | Status: DC
  Filled 2023-09-23: qty 60, 30d supply, fill #0

## 2023-11-04 ENCOUNTER — Encounter: Payer: Self-pay | Admitting: Radiology

## 2023-11-06 ENCOUNTER — Other Ambulatory Visit (HOSPITAL_BASED_OUTPATIENT_CLINIC_OR_DEPARTMENT_OTHER): Payer: Self-pay

## 2023-11-06 ENCOUNTER — Ambulatory Visit (INDEPENDENT_AMBULATORY_CARE_PROVIDER_SITE_OTHER): Admitting: Family Medicine

## 2023-11-06 ENCOUNTER — Encounter: Payer: Self-pay | Admitting: Family Medicine

## 2023-11-06 VITALS — BP 112/68 | HR 67 | Temp 97.6°F | Wt 158.6 lb

## 2023-11-06 DIAGNOSIS — L03115 Cellulitis of right lower limb: Secondary | ICD-10-CM

## 2023-11-06 MED ORDER — DOXYCYCLINE HYCLATE 100 MG PO CAPS
100.0000 mg | ORAL_CAPSULE | Freq: Two times a day (BID) | ORAL | 0 refills | Status: DC
  Filled 2023-11-06: qty 20, 10d supply, fill #0

## 2023-11-06 NOTE — Progress Notes (Signed)
 Established Patient Office Visit  Subjective   Patient ID: Clayton Avery, male    DOB: 23-Sep-2000  Age: 23 y.o. MRN: [id number]  Chief Complaint  Patient presents with   Insect Bite    HPI    Clayton Avery is seen today with possible insect bite right foot.  First noted yesterday some redness had some progressive swelling and redness and pain since then.  Has also noticed some local warmth.  Denies any systemic fevers or chills.  Did not actually see any kind of insect bite.  He has taken some Tylenol  and Benadryl .  Has definitely had some progressive redness today.  Past Medical History:  Diagnosis Date   ADHD (attention deficit hyperactivity disorder)    ADHD (attention deficit hyperactivity disorder)    Asthma    Migraine    Otitis    Tethered cord (HCC)    As a infant   Past Surgical History:  Procedure Laterality Date   BACK SURGERY     as a infant   LUMBAR LAMINECTOMY FOR TETHERED CORD RELEASE  2003   spinal cord surgery     as infant   tubes in ears     as a child    reports that he quit smoking about 2 years ago. His smoking use included cigarettes. He started smoking about 7 years ago. He has a 1.3 pack-year smoking history. He has never used smokeless tobacco. He reports current alcohol use. He reports that he does not currently use drugs. family history includes Aneurysm in his paternal grandmother. No Known Allergies  Review of Systems  Constitutional:  Negative for chills and fever.      Objective:     BP 112/68   Pulse 67   Temp 97.6 F (36.4 C) (Oral)   Wt 158 lb 9.6 oz (71.9 kg)   SpO2 98%   BMI 20.92 kg/m  BP Readings from Last 3 Encounters:  11/06/23 112/68  08/14/23 116/84  05/09/23 138/86   Wt Readings from Last 3 Encounters:  11/06/23 158 lb 9.6 oz (71.9 kg)  08/14/23 158 lb (71.7 kg)  05/09/23 170 lb (77.1 kg)      Physical Exam Vitals reviewed.  Constitutional:      General: He is not in acute distress.    Appearance:  He is not ill-appearing.  Cardiovascular:     Rate and Rhythm: Normal rate and regular rhythm.  Skin:    Comments: Right foot reveals area 5-1/2 x 6 cm of erythema and mild swelling.  Slightly punctate center but nonpustular.  No necrosis.  Diffusely tender.  Neurological:     Mental Status: He is alert.      No results found for any visits on 11/06/23.    The ASCVD Risk score (Arnett DK, et al., 2019) failed to calculate for the following reasons:   The 2019 ASCVD risk score is only valid for ages 69 to 87    Assessment & Plan:   Cellulitis right foot.  We explained that bite would be unlikely with no pruritus and significant amount of pain.  Cannot rule out early abscess but this appears to be more cellulitis currently.  -Start doxycycline  100 mg twice daily for 10 days - Elevate foot frequently - Recommend warm compresses or warm soaks several times daily - Follow-up immediately for any progressive redness or swelling or fever - Low threshold to consider podiatry referral for any progressive swelling or other signs of progressive infection as above  Wolm Scarlet, MD

## 2023-11-06 NOTE — Patient Instructions (Signed)
 Elevate foot frequently  Use warm compresses several times daily  Follow up for any fever, chills, or progressive swelling or redness.

## 2023-11-07 ENCOUNTER — Ambulatory Visit: Payer: Self-pay

## 2023-11-07 NOTE — Telephone Encounter (Signed)
 FYI Only or Action Required?: Action required by provider: clinical question for provider.  Patient was last seen in primary care on 11/06/2023 by Micheal Wolm ORN, MD.  Called Nurse Triage reporting Foot Swelling.  Symptoms began several days ago.  Interventions attempted: Prescription medications: Antibiotics and Rest, hydration, or home remedies.  Symptoms are: gradually worsening.  Triage Disposition: Call PCP Now  Patient/caregiver understands and will follow disposition?: Yes         Copied from CRM [id number]. Topic: Clinical - Red Word Triage >> Nov 07, 2023  8:40 AM Zy'onna H wrote: [facility] that prompted transfer to Nurse Triage: Patient stated that the infection is not getting any better and he actually believes that its progressing.   Central swelling worse Darker in Color  Severe Pain **Transf. To NT** Reason for Disposition  [1] Follow-up call from patient regarding patient's clinical status AND [2] information urgent  Answer Assessment - Initial Assessment Questions Pt called to report that swelling is worse and area is darker in color. He states has started the antibiotic. Denies fever or numbness to the area.  Currently elevating foot frequently and using a warm compresses for symptoms. He denies an increase in pain but states his pain is a 7/10. He would like a call back about next steps from a provider. Best contact number [phone].      1. REASON FOR CALL or QUESTION: What is your reason for calling today? or How can I best     Advised to call back regarding any worsening symptoms per provider he saw yesterday.  2. CALLER: Document the source of call. (e.g., laboratory staff, caregiver or patient).    Patient  Protocols used: PCP Call - No Triage-A-AH

## 2023-11-08 ENCOUNTER — Ambulatory Visit: Admitting: Family Medicine

## 2023-11-10 ENCOUNTER — Other Ambulatory Visit: Payer: Self-pay | Admitting: Family Medicine

## 2023-11-10 DIAGNOSIS — F909 Attention-deficit hyperactivity disorder, unspecified type: Secondary | ICD-10-CM

## 2023-11-11 ENCOUNTER — Other Ambulatory Visit: Payer: Self-pay

## 2023-11-11 ENCOUNTER — Other Ambulatory Visit (HOSPITAL_BASED_OUTPATIENT_CLINIC_OR_DEPARTMENT_OTHER): Payer: Self-pay

## 2023-11-11 MED ORDER — AMPHETAMINE-DEXTROAMPHETAMINE 20 MG PO TABS
20.0000 mg | ORAL_TABLET | Freq: Two times a day (BID) | ORAL | 0 refills | Status: DC
  Filled 2023-11-11: qty 60, 30d supply, fill #0

## 2023-11-19 ENCOUNTER — Encounter: Admitting: Family Medicine

## 2023-12-11 ENCOUNTER — Ambulatory Visit: Admitting: Family Medicine

## 2023-12-11 ENCOUNTER — Other Ambulatory Visit (HOSPITAL_BASED_OUTPATIENT_CLINIC_OR_DEPARTMENT_OTHER): Payer: Self-pay

## 2023-12-11 ENCOUNTER — Ambulatory Visit
Admission: EM | Admit: 2023-12-11 | Discharge: 2023-12-11 | Disposition: A | Discharge status: Home / Self Care | Attending: Nurse Practitioner | Admitting: Nurse Practitioner

## 2023-12-11 DIAGNOSIS — J069 Acute upper respiratory infection, unspecified: Secondary | ICD-10-CM

## 2023-12-11 DIAGNOSIS — R051 Acute cough: Secondary | ICD-10-CM | POA: Diagnosis not present

## 2023-12-11 LAB — POC SOFIA SARS ANTIGEN FIA: SARS Coronavirus 2 Ag: NEGATIVE

## 2023-12-11 MED ORDER — PROMETHAZINE-DM 6.25-15 MG/5ML PO SYRP
10.0000 mL | ORAL_SOLUTION | Freq: Four times a day (QID) | ORAL | 0 refills | Status: DC | PRN
  Filled 2023-12-11: qty 118, 3d supply, fill #0

## 2023-12-11 MED ORDER — MUCINEX DM MAXIMUM STRENGTH 60-1200 MG PO TB12
1.0000 | ORAL_TABLET | Freq: Two times a day (BID) | ORAL | 0 refills | Status: DC
  Filled 2023-12-11: qty 14, 7d supply, fill #0

## 2023-12-11 MED ORDER — PREDNISONE 20 MG PO TABS
40.0000 mg | ORAL_TABLET | Freq: Every day | ORAL | 0 refills | Status: AC
  Filled 2023-12-11: qty 10, 5d supply, fill #0

## 2023-12-11 MED ORDER — AZITHROMYCIN 250 MG PO TABS
ORAL_TABLET | ORAL | 0 refills | Status: AC
  Filled 2023-12-11: qty 6, 5d supply, fill #0

## 2023-12-11 MED ORDER — IPRATROPIUM-ALBUTEROL 0.5-2.5 (3) MG/3ML IN SOLN
3.0000 mL | Freq: Once | RESPIRATORY_TRACT | Status: AC
  Administered 2023-12-11: 3 mL via RESPIRATORY_TRACT

## 2023-12-11 MED ORDER — CETIRIZINE-PSEUDOEPHEDRINE ER 5-120 MG PO TB12
1.0000 | ORAL_TABLET | Freq: Every day | ORAL | 0 refills | Status: DC
  Filled 2023-12-11: qty 12, 12d supply, fill #0

## 2023-12-11 NOTE — ED Provider Notes (Signed)
 [facility]    CSN: [id number] Arrival date & time: 12/11/23  9167      History   Chief Complaint Chief Complaint  Patient presents with   Cough    HPI Clayton Avery is a 23 y.o. male.   Discussed the use of AI scribe software for clinical note transcription with the patient, who gave verbal consent to proceed.   The patient presents with a five-day history of illness that significantly worsened yesterday, describing the progression as feeling like a train hit me. Initially, the patient remained functional and attributed symptoms to stress related to finals week but experienced marked deterioration over the past 24 hours, with inability to sleep last night due to symptom severity. The patient reports fevers overnight ranging from 100-101F, managed with Tylenol . Primary symptoms include a severe productive cough with associated chest pain that worsens with deep inspiration. The patient notes a mild runny nose but denies sore throat. Additional symptoms include generalized body aches and mild headaches occurring with fever. For symptom management, the patient took Sudafed yesterday with partial relief of rhinorrhea and used cough medicine last night without significant benefit. The patient denies known sick contacts as well as nausea, vomiting, or diarrhea.  The following sections of the patient's history were reviewed and updated as appropriate: allergies, current medications, past family history, past medical history, past social history, past surgical history, and problem list.        Past Medical History:  Diagnosis Date   ADHD (attention deficit hyperactivity disorder)    ADHD (attention deficit hyperactivity disorder)    Asthma    Migraine    Otitis    Tethered cord (HCC)    As a infant    Patient Active Problem List   Diagnosis Date Noted   Attention deficit hyperactivity disorder (ADHD) 08/22/2022   Acne 08/22/2022   Alcohol use 10/26/2020    Generalized anxiety disorder 10/26/2020   Mood disorder 10/26/2020    Past Surgical History:  Procedure Laterality Date   BACK SURGERY     as a infant   LUMBAR LAMINECTOMY FOR TETHERED CORD RELEASE  2003   spinal cord surgery     as infant   tubes in ears     as a child       Home Medications    Prior to Admission medications   Medication Sig Start Date End Date Taking? Authorizing Provider  azithromycin  (ZITHROMAX ) 250 MG tablet Take 2 tablets (500 mg total) by mouth daily for 1 day, THEN 1 tablet (250 mg total) daily for 4 days. Take first 2 tablets together, then 1 every day until finished. 12/11/23 12/16/23 Yes Iola Lukes, FNP  cetirizine -pseudoephedrine  (ZYRTEC -D) 5-120 MG tablet Take 1 tablet by mouth daily with breakfast for 10 days. 12/11/23 12/21/23 Yes Iola Lukes, FNP  Dextromethorphan -guaiFENesin  (MUCINEX  DM MAXIMUM STRENGTH) 60-1200 MG TB12 Take 1 tablet by mouth 2 (two) times daily. 12/11/23  Yes Valon Glasscock, FNP  predniSONE  (DELTASONE ) 20 MG tablet Take 2 tablets (40 mg total) by mouth daily for 5 days. 12/11/23 12/16/23 Yes Iola Lukes, FNP  promethazine -dextromethorphan  (PROMETHAZINE -DM) 6.25-15 MG/5ML syrup Take 10 mLs by mouth every 6 (six) hours as needed for cough. 12/11/23  Yes Iola Lukes, FNP  amphetamine -dextroamphetamine  (ADDERALL) 20 MG tablet Take 1 tablet (20 mg total) by mouth 2 (two) times daily. 11/11/23   Billy Philippe SAUNDERS, NP    Family History Family History  Problem Relation Age of Onset   Aneurysm Paternal Grandmother  Social History Social History   Tobacco Use   Smoking status: Never   Smokeless tobacco: Never  Vaping Use   Vaping status: Former  Substance Use Topics   Alcohol use: Yes    Comment: occ   Drug use: Not Currently    Types: Marijuana     Allergies   Patient has no known allergies.   Review of Systems Review of Systems  Constitutional:  Positive for fever (TMAX 101).  Negative for chills and diaphoresis.  HENT:  Positive for congestion, rhinorrhea and voice change. Negative for sneezing and sore throat.   Respiratory:  Positive for cough (productive) and shortness of breath.        Chest congestion   Gastrointestinal:  Negative for diarrhea, nausea and vomiting.  Musculoskeletal:  Positive for myalgias.  Neurological:  Positive for headaches.  Psychiatric/Behavioral:  Positive for sleep disturbance.   All other systems reviewed and are negative.    Physical Exam Triage Vital Signs ED Triage Vitals  Encounter Vitals Group     BP 12/11/23 0905 118/72     Girls Systolic BP Percentile --      Girls Diastolic BP Percentile --      Boys Systolic BP Percentile --      Boys Diastolic BP Percentile --      Pulse Rate 12/11/23 0905 (!) 104     Resp 12/11/23 0905 20     Temp 12/11/23 0905 98.7 F (37.1 C)     Temp Source 12/11/23 0905 Oral     SpO2 12/11/23 0905 97 %     Weight --      Height --      Head Circumference --      Peak Flow --      Pain Score 12/11/23 0903 3     Pain Loc --      Pain Education --      Exclude from Growth Chart --    No data found.  Updated Vital Signs BP 118/72 (BP Location: Right Arm)   Pulse (!) 104   Temp 98.7 F (37.1 C) (Oral)   Resp 20   SpO2 97%   Visual Acuity Right Eye Distance:   Left Eye Distance:   Bilateral Distance:    Right Eye Near:   Left Eye Near:    Bilateral Near:     Physical Exam Vitals reviewed.  Constitutional:      General: He is awake. He is not in acute distress.    Appearance: Normal appearance. He is well-developed. He is not ill-appearing, toxic-appearing or diaphoretic.  HENT:     Head: Normocephalic.     Right Ear: Hearing, tympanic membrane, ear canal and external ear normal. No drainage, swelling or tenderness. No middle ear effusion. Tympanic membrane is not erythematous.     Left Ear: Hearing, tympanic membrane, ear canal and external ear normal. No drainage,  swelling or tenderness.  No middle ear effusion. Tympanic membrane is not erythematous.     Nose: Congestion present.     Mouth/Throat:     Lips: Pink.     Mouth: Mucous membranes are moist.     Pharynx: Oropharynx is clear. Uvula midline. No pharyngeal swelling, oropharyngeal exudate, posterior oropharyngeal erythema or uvula swelling.     Tonsils: No tonsillar exudate or tonsillar abscesses.  Eyes:     General: Vision grossly intact.     Conjunctiva/sclera: Conjunctivae normal.  Cardiovascular:     Rate and Rhythm: Normal rate.  Heart sounds: Normal heart sounds.  Pulmonary:     Effort: Pulmonary effort is normal. No tachypnea or respiratory distress.     Breath sounds: Normal breath sounds and air entry. No decreased air movement. No decreased breath sounds or wheezing.     Comments: Respirations even and unlabored  Musculoskeletal:        General: Normal range of motion.     Cervical back: Normal range of motion and neck supple.  Lymphadenopathy:     Cervical: No cervical adenopathy.  Skin:    General: Skin is warm and dry.  Neurological:     General: No focal deficit present.     Mental Status: He is alert and oriented to person, place, and time.  Psychiatric:        Behavior: Behavior is cooperative.      UC Treatments / Results  Labs (all labs ordered are listed, but only abnormal results are displayed) Labs Reviewed  POC SOFIA SARS ANTIGEN FIA - Normal    EKG   Radiology No results found.  Procedures Procedures (including critical care time)  Medications Ordered in UC Medications  ipratropium-albuterol  (DUONEB) 0.5-2.5 (3) MG/3ML nebulizer solution 3 mL (3 mLs Nebulization Given 12/11/23 0933)    Initial Impression / Assessment and Plan / UC Course  I have reviewed the triage vital signs and the nursing notes.  Pertinent labs & imaging results that were available during my care of the patient were reviewed by me and considered in my medical decision  making (see chart for details).     The patient presents with a five-day history of progressive respiratory symptoms that significantly worsened over the past 24 hours, including productive cough with pleuritic chest discomfort worsened by deep inspiration, low-grade fevers ranging from 100-101F responsive to Tylenol , mild rhinorrhea, body aches, and headaches. Lung examination is normal without wheezing, rales, or signs of respiratory distress. COVID-19 testing is negative. Overall presentation is most consistent with an acute viral upper respiratory infection without evidence of pneumonia or other emergent cardiopulmonary pathology at this time. A breathing treatment was administered in clinic for chest tightness and discomfort with inspiration. Medications were prescribed including azithromycin , prednisone , Zyrtec -D, Mucinex  DM, and promethazine  DM for airway inflammation and symptomatic relief, along with instructions for supportive care measures such as rest, hydration, and fever control.  The patient was advised to follow up with their primary care provider if symptoms do not improve over the next several days, worsen, or persist beyond the expected course. Immediate emergency evaluation was recommended for increasing shortness of breath, chest pain not associated with coughing or that becomes severe or persistent, high or prolonged fever, inability to keep fluids down, bluish discoloration of lips or fingertips, confusion, fainting, or signs of respiratory distress.  Today's evaluation has revealed no signs of a dangerous process. Discussed diagnosis with patient and/or guardian. Patient and/or guardian aware of their diagnosis, possible red flag symptoms to watch out for and need for close follow up. Patient and/or guardian understands verbal and written discharge instructions. Patient and/or guardian comfortable with plan and disposition.  Patient and/or guardian has a clear mental status at this  time, good insight into illness (after discussion and teaching) and has clear judgment to make decisions regarding their care  Documentation was completed with the aid of voice recognition software. Transcription may contain typographical errors.  Final Clinical Impressions(s) / UC Diagnoses   Final diagnoses:  Acute cough  Upper respiratory tract infection, unspecified type  Discharge Instructions      You were seen today for cough, fever, body aches, headache, and chest discomfort that are most consistent with a viral upper respiratory infection. Your lung exam was normal and your COVID test was negative, which is reassuring. A breathing treatment was given in the clinic to help relieve chest tightness and discomfort with deep breathing. You were prescribed azithromycin , prednisone , Zyrtec -D, Mucinex  DM, and promethazine  DM to help reduce airway inflammation, improve congestion and cough, and manage symptoms. Take all medications exactly as prescribed.  At home, continue supportive care by resting, drinking plenty of fluids to stay well hydrated and thin mucus, and using acetaminophen  or ibuprofen  for fever or body aches as needed. Warm fluids, humidified air or steamy showers, throat lozenges, and sitting upright when resting can help ease coughing. Avoid smoking, vaping, and strong scents that can worsen airway irritation. Try to get adequate sleep and avoid strenuous activity until symptoms improve.  Follow up with your primary care provider if symptoms are not improving within the next several days, persist beyond 7-10 days, become more severe, or interfere with your ability to carry out daily activities. Seek immediate emergency care for worsening shortness of breath, chest pain that is severe or not related to coughing, persistent or high fever not responding to medication, inability to speak in full sentences, blue or gray discoloration of lips or fingertips, confusion or fainting,  repeated vomiting, or any signs that your breathing is becoming labored or your overall condition is rapidly worsening.      ED Prescriptions     Medication Sig Dispense Auth. Provider   azithromycin  (ZITHROMAX ) 250 MG tablet Take 2 tablets (500 mg total) by mouth daily for 1 day, THEN 1 tablet (250 mg total) daily for 4 days. Take first 2 tablets together, then 1 every day until finished. 6 tablet Iola Lukes, FNP   predniSONE  (DELTASONE ) 20 MG tablet Take 2 tablets (40 mg total) by mouth daily for 5 days. 10 tablet Iola Lukes, FNP   cetirizine -pseudoephedrine  (ZYRTEC -D) 5-120 MG tablet Take 1 tablet by mouth daily with breakfast for 10 days. 10 tablet Iola Lukes, FNP   Dextromethorphan -guaiFENesin  (MUCINEX  DM MAXIMUM STRENGTH) 60-1200 MG TB12 Take 1 tablet by mouth 2 (two) times daily. 20 tablet Iola Lukes, FNP   promethazine -dextromethorphan  (PROMETHAZINE -DM) 6.25-15 MG/5ML syrup Take 10 mLs by mouth every 6 (six) hours as needed for cough. 118 mL Iola Lukes, FNP      PDMP not reviewed this encounter.   Iola Lukes, [place] 12/11/23 [phone]

## 2023-12-11 NOTE — Discharge Instructions (Addendum)
 You were seen today for cough, fever, body aches, headache, and chest discomfort that are most consistent with a viral upper respiratory infection. Your lung exam was normal and your COVID test was negative, which is reassuring. A breathing treatment was given in the clinic to help relieve chest tightness and discomfort with deep breathing. You were prescribed azithromycin , prednisone , Zyrtec -D, Mucinex  DM, and promethazine  DM to help reduce airway inflammation, improve congestion and cough, and manage symptoms. Take all medications exactly as prescribed.  At home, continue supportive care by resting, drinking plenty of fluids to stay well hydrated and thin mucus, and using acetaminophen  or ibuprofen  for fever or body aches as needed. Warm fluids, humidified air or steamy showers, throat lozenges, and sitting upright when resting can help ease coughing. Avoid smoking, vaping, and strong scents that can worsen airway irritation. Try to get adequate sleep and avoid strenuous activity until symptoms improve.  Follow up with your primary care provider if symptoms are not improving within the next several days, persist beyond 7-10 days, become more severe, or interfere with your ability to carry out daily activities. Seek immediate emergency care for worsening shortness of breath, chest pain that is severe or not related to coughing, persistent or high fever not responding to medication, inability to speak in full sentences, blue or gray discoloration of lips or fingertips, confusion or fainting, repeated vomiting, or any signs that your breathing is becoming labored or your overall condition is rapidly worsening.

## 2023-12-11 NOTE — ED Triage Notes (Signed)
 Pt c/o prod cough/SHOB, head/chest congestion, fever, body aches-sx started 12/5-feels worse since yesterday-last dose tylenol  5am-NAD-steady gait

## 2023-12-19 ENCOUNTER — Ambulatory Visit: Payer: Self-pay | Admitting: Family Medicine

## 2023-12-19 ENCOUNTER — Other Ambulatory Visit (HOSPITAL_BASED_OUTPATIENT_CLINIC_OR_DEPARTMENT_OTHER): Payer: Self-pay

## 2023-12-19 ENCOUNTER — Ambulatory Visit: Admitting: Family Medicine

## 2023-12-19 VITALS — BP 110/80 | HR 78 | Temp 98.3°F | Ht 73.0 in | Wt 165.0 lb

## 2023-12-19 DIAGNOSIS — L03211 Cellulitis of face: Secondary | ICD-10-CM | POA: Diagnosis not present

## 2023-12-19 DIAGNOSIS — F909 Attention-deficit hyperactivity disorder, unspecified type: Secondary | ICD-10-CM

## 2023-12-19 DIAGNOSIS — Z0000 Encounter for general adult medical examination without abnormal findings: Secondary | ICD-10-CM

## 2023-12-19 LAB — COMPREHENSIVE METABOLIC PANEL WITH GFR
ALT: 44 U/L (ref 3–53)
AST: 25 U/L (ref 5–37)
Albumin: 4.4 g/dL (ref 3.5–5.2)
Alkaline Phosphatase: 38 U/L — ABNORMAL LOW (ref 39–117)
BUN: 13 mg/dL (ref 6–23)
CO2: 28 meq/L (ref 19–32)
Calcium: 9.3 mg/dL (ref 8.4–10.5)
Chloride: 102 meq/L (ref 96–112)
Creatinine, Ser: 0.84 mg/dL (ref 0.40–1.50)
GFR: 123.43 mL/min (ref >60.00)
Glucose, Bld: 87 mg/dL (ref 70–99)
Potassium: 4.4 meq/L (ref 3.5–5.1)
Sodium: 137 meq/L (ref 135–145)
Total Bilirubin: 0.6 mg/dL (ref 0.2–1.2)
Total Protein: 7.4 g/dL (ref 6.0–8.3)

## 2023-12-19 LAB — CBC WITH DIFFERENTIAL/PLATELET
Basophils Absolute: 0 K/uL (ref 0.0–0.1)
Basophils Relative: 0.3 % (ref 0.0–3.0)
Eosinophils Absolute: 0.2 K/uL (ref 0.0–0.7)
Eosinophils Relative: 2.8 % (ref 0.0–5.0)
HCT: 44.2 % (ref 39.0–52.0)
Hemoglobin: 15.5 g/dL (ref 13.0–17.0)
Lymphocytes Relative: 29.8 % (ref 12.0–46.0)
Lymphs Abs: 1.8 K/uL (ref 0.7–4.0)
MCHC: 35 g/dL (ref 30.0–36.0)
MCV: 84.6 fl (ref 78.0–100.0)
Monocytes Absolute: 0.5 K/uL (ref 0.1–1.0)
Monocytes Relative: 8 % (ref 3.0–12.0)
Neutro Abs: 3.5 K/uL (ref 1.4–7.7)
Neutrophils Relative %: 59.1 % (ref 43.0–77.0)
Platelets: 242 K/uL (ref 150.0–400.0)
RBC: 5.22 Mil/uL (ref 4.22–5.81)
RDW: 13.9 % (ref 11.5–15.5)
WBC: 6 K/uL (ref 4.0–10.5)

## 2023-12-19 LAB — TSH: TSH: 1 u[IU]/mL (ref 0.35–5.50)

## 2023-12-19 MED ORDER — DOXYCYCLINE HYCLATE 100 MG PO TABS
100.0000 mg | ORAL_TABLET | Freq: Two times a day (BID) | ORAL | 0 refills | Status: AC
  Filled 2023-12-19: qty 14, 7d supply, fill #0

## 2023-12-19 MED ORDER — AMPHETAMINE-DEXTROAMPHETAMINE 20 MG PO TABS
20.0000 mg | ORAL_TABLET | Freq: Two times a day (BID) | ORAL | 0 refills | Status: AC
  Filled 2023-12-19: qty 60, 30d supply, fill #0

## 2023-12-19 NOTE — Patient Instructions (Signed)
-  It was a pleasure to see you today.  -Physical exam completed today. -Please upload immunization records to MyChart.  -Ordered labs for annual wellness visit. Office will call with lab results and will be available via MyChart.  -Continue a healthy diet and regular exercise. -Ordered urine drug screen for management of Adderall.  -Refilled medication.  -Prescribed Doxycycline  100mg  tablet, take 1 tablet every 12 hours for 7 days for cellulitis on the face.  -Recommend to cleanse the face with mild soap and pat dry twice a day. -Follow up in within the next month to discuss further concerns.

## 2023-12-19 NOTE — Progress Notes (Signed)
 Complete physical exam  Patient: Clayton Avery   DOB: 15-Oct-2000   23 y.o. Male  MRN: [id number]  Subjective:    Chief Complaint  Patient presents with   Annual Exam    Clayton Avery is a 23 y.o. male who presents today for a complete physical exam. He reports consuming a general diet. Exercising: Cardio and weight training a few times a week.  He generally feels well. He reports sleeping well. He does not have additional problems to discuss today.    Most recent fall risk assessment:    12/19/2023    8:30 AM  Fall Risk   Falls in the past year? 0  Number falls in past yr: 0  Injury with Fall? 0  Risk for fall due to : No Fall Risks     Most recent depression screenings:    12/19/2023    8:30 AM 08/14/2023   10:25 AM  PHQ 2/9 Scores  PHQ - 2 Score 0 0  PHQ- 9 Score 2 2      Data saved with a previous flowsheet row definition    Vision:Within last year and Dental: No current dental problems and Receives regular dental care  Past Medical History:  Diagnosis Date   ADHD (attention deficit hyperactivity disorder)    ADHD (attention deficit hyperactivity disorder)    Asthma    Migraine    Otitis    Tethered cord (HCC)    As a infant   Past Surgical History:  Procedure Laterality Date   BACK SURGERY     as a infant   LUMBAR LAMINECTOMY FOR TETHERED CORD RELEASE  2003   spinal cord surgery     as infant   tubes in ears     as a child   Social History[1] Social History   Socioeconomic History   Marital status: Single    Spouse name: Not on file   Number of children: 0   Years of education: Not on file   Highest education level: Some college, no degree  Occupational History   Not on file  Tobacco Use   Smoking status: Never   Smokeless tobacco: Never  Vaping Use   Vaping status: Former  Substance and Sexual Activity   Alcohol use: Yes    Comment: occ   Drug use: Not Currently    Types: Marijuana   Sexual activity: Yes  Other  Topics Concern   Not on file  Social History Narrative   Not on file   Social Drivers of Health   Tobacco Use: Low Risk (12/19/2023)   Patient History    Smoking Tobacco Use: Never    Smokeless Tobacco Use: Never    Passive Exposure: Not on file  Recent Concern: Tobacco Use - Medium Risk (11/06/2023)   Patient History    Smoking Tobacco Use: Former    Smokeless Tobacco Use: Never    Passive Exposure: Not on Actuary Strain: Low Risk (12/19/2023)   Overall Financial Resource Strain (CARDIA)    Difficulty of Paying Living Expenses: Not very hard  Food Insecurity: No Food Insecurity (12/19/2023)   Epic    Worried About Radiation Protection Practitioner of Food in the Last Year: Never true    Ran Out of Food in the Last Year: Never true  Transportation Needs: No Transportation Needs (12/19/2023)   Epic    Lack of Transportation (Medical): No    Lack of Transportation (Non-Medical): No  Physical Activity: Sufficiently Active (  12/19/2023)   Exercise Vital Sign    Days of Exercise per Week: 4 days    Minutes of Exercise per Session: 40 min  Stress: No Stress Concern Present (12/19/2023)   [employer] of Occupational Health - Occupational Stress Questionnaire    Feeling of Stress: Not at all  Social Connections: Moderately Isolated (12/19/2023)   Social Connection and Isolation Panel    Frequency of Communication with Friends and Family: More than three times a week    Frequency of Social Gatherings with Friends and Family: More than three times a week    Attends Religious Services: 1 to 4 times per year    Active Member of [employer] or Organizations: No    Attends Banker Meetings: Not on file    Marital Status: Never married  Intimate Partner Violence: Not At Risk (08/22/2022)   Humiliation, Afraid, Rape, and Kick questionnaire    Fear of Current or Ex-Partner: No    Emotionally Abused: No    Physically Abused: No    Sexually Abused: No  Depression (PHQ2-9): Low Risk  (12/19/2023)   Depression (PHQ2-9)    PHQ-2 Score: 2  Alcohol Screen: Low Risk (12/19/2023)   Alcohol Screen    Last Alcohol Screening Score (AUDIT): 2  Housing: Low Risk (12/19/2023)   Epic    Unable to Pay for Housing in the Last Year: No    Number of Times Moved in the Last Year: 0    Homeless in the Last Year: No  Utilities: Not At Risk (08/22/2022)   [employer] Utilities    Threatened with loss of utilities: No  Health Literacy: Adequate Health Literacy (08/22/2022)   B1300 Health Literacy    Frequency of need for help with medical instructions: Never   Family Status  Relation Name Status   Mother  Alive   Father  Alive   Sister  Alive   MGM  Unknown   MGF  Alive   PGM  Deceased   PGF  Deceased  No partnership data on file   Family History  Problem Relation Age of Onset   Aneurysm Paternal Grandmother    Allergies[2]   Patient Care Team: Billy Philippe SAUNDERS, NP as PCP - General (Family Medicine)   Show/hide medication list[3]  Review of Systems  Constitutional:  Positive for diaphoresis.  Skin:  Positive for rash.   See HPI above    Objective:   BP 110/80   Pulse 78   Temp 98.3 F (36.8 C) (Oral)   Ht 6' 1 (1.854 m)   Wt 165 lb (74.8 kg)   SpO2 99%   BMI 21.77 kg/m    Physical Exam Vitals reviewed.  Constitutional:      General: He is not in acute distress.    Appearance: Normal appearance. He is not ill-appearing or toxic-appearing.  HENT:     Head: Normocephalic and atraumatic.     Right Ear: Tympanic membrane, ear canal and external ear normal. There is no impacted cerumen.     Left Ear: Tympanic membrane, ear canal and external ear normal. There is no impacted cerumen.     Mouth/Throat:     Mouth: Mucous membranes are moist.     Pharynx: Oropharynx is clear. No oropharyngeal exudate or posterior oropharyngeal erythema.  Eyes:     General:        Right eye: No discharge.        Left eye: No discharge.     Conjunctiva/sclera: Conjunctivae  normal.     Pupils: Pupils are equal, round, and reactive to light.  Neck:     Thyroid : No thyromegaly.  Cardiovascular:     Rate and Rhythm: Normal rate and regular rhythm.     Pulses:          Posterior tibial pulses are 2+ on the right side and 2+ on the left side.     Heart sounds: Normal heart sounds. No murmur heard.    No friction rub. No gallop.  Pulmonary:     Effort: Pulmonary effort is normal. No respiratory distress.     Breath sounds: Normal breath sounds.  Abdominal:     General: Abdomen is flat. Bowel sounds are normal.     Palpations: Abdomen is soft.     Tenderness: There is no right CVA tenderness or left CVA tenderness.  Musculoskeletal:        General: Normal range of motion.     Cervical back: Normal range of motion.     Right lower leg: No edema.     Left lower leg: No edema.  Lymphadenopathy:     Cervical: No cervical adenopathy.  Skin:    General: Skin is warm and dry.     Findings: Rash (See picture) present.  Neurological:     General: No focal deficit present.     Mental Status: He is alert. Mental status is at baseline.     Motor: No weakness.     Gait: Gait normal.  Psychiatric:        Mood and Affect: Mood normal.        Behavior: Behavior normal.        Thought Content: Thought content normal.        Judgment: Judgment normal.         Assessment & Plan:    Routine Health Maintenance and Physical Exam  Immunization History  Administered Date(s) Administered   PFIZER(Purple Top)SARS-COV-2 Vaccination 04/16/2019   Tdap 08/22/2022    Health Maintenance  Topic Date Due   Meningococcal B Vaccine (1 of 2 - Standard) Never done   Hepatitis B Vaccines 19-59 Average Risk (1 of 3 - 19+ 3-dose series) Never done   Influenza Vaccine  Never done   Hepatitis C Screening  Completed   HIV Screening  Completed   Pneumococcal Vaccine  Aged Out   DTaP/Tdap/Td  Discontinued   HPV VACCINES  Discontinued   COVID-19 Vaccine  Discontinued     Discussed health benefits of physical activity, and encouraged him to engage in regular exercise appropriate for his age and condition.  Annual physical exam -     CBC with Differential/Platelet -     Comprehensive metabolic panel with GFR -     TSH  Attention deficit hyperactivity disorder (ADHD), unspecified ADHD type -     DRUG MONITOR, PANEL 1, SCREEN, URINE -     Amphetamine -Dextroamphetamine ; Take 1 tablet (20 mg total) by mouth 2 (two) times daily.  Dispense: 60 tablet; Refill: 0  Cellulitis of face -     Doxycycline  Hyclate; Take 1 tablet (100 mg total) by mouth 2 (two) times daily for 7 days.  Dispense: 14 tablet; Refill: 0  1.Review health maintenance:  -Meningococcal B vaccine: Probably had-advised to upload immunization record to MyChart  -Hep B vaccine: Probably had-advised to upload immunization record to MyChart  -Influenza vaccine: Declines  2.Physical exam completed today. 3.Ordered labs (CBC, CMP, and TSH) for annual wellness visit. Office will call  with lab results and will be available via MyChart.  4.Continue a healthy diet and regular exercise. 5.Ordered urine drug screen for management of Adderall. Controlled substance contract is UTD. PDMP reviewed. Last refill was 11/11/2023. Refilled medication.  6.Prescribed Doxycycline  100mg  tablet, take 1 tablet every 12 hours for 7 days for cellulitis on the face.  7.Recommend to cleanse the face with mild soap and pat dry twice a day. 8. Follow up in within the next month to discuss further concerns. Return in about 1 month (around 01/19/2024) for follow-up-further concerns .     Philippe Slade, NP     [1]  Social History Tobacco Use   Smoking status: Never   Smokeless tobacco: Never  Vaping Use   Vaping status: Former  Substance Use Topics   Alcohol use: Yes    Comment: occ   Drug use: Not Currently    Types: Marijuana  [2] No Known Allergies [3]  Outpatient Medications Prior to Visit  Medication  Sig   [DISCONTINUED] amphetamine -dextroamphetamine  (ADDERALL) 20 MG tablet Take 1 tablet (20 mg total) by mouth 2 (two) times daily.   [DISCONTINUED] cetirizine -pseudoephedrine  (ZYRTEC -D) 5-120 MG tablet Take 1 tablet by mouth daily with breakfast.   [DISCONTINUED] Dextromethorphan -guaiFENesin  (MUCINEX  DM MAXIMUM STRENGTH) 60-1200 MG TB12 Take 1 tablet by mouth 2 (two) times daily.   [DISCONTINUED] promethazine -dextromethorphan  (PROMETHAZINE -DM) 6.25-15 MG/5ML syrup Take 10 mLs by mouth every 6 (six) hours as needed for cough.   No facility-administered medications prior to visit.

## 2023-12-20 LAB — DRUG MONITOR, PANEL 1, SCREEN, URINE
Amphetamines: NEGATIVE ng/mL
Barbiturates: NEGATIVE ng/mL
Benzodiazepines: NEGATIVE ng/mL
Cocaine Metabolite: NEGATIVE ng/mL
Creatinine: 114.3 mg/dL
Marijuana Metabolite: NEGATIVE ng/mL
Methadone Metabolite: NEGATIVE ng/mL
Opiates: NEGATIVE ng/mL
Oxidant: NEGATIVE ug/mL
Oxycodone: NEGATIVE ng/mL
Phencyclidine: NEGATIVE ng/mL
pH: 7.8 (ref 4.5–9.0)

## 2023-12-20 LAB — DM TEMPLATE

## 2023-12-21 ENCOUNTER — Other Ambulatory Visit (HOSPITAL_BASED_OUTPATIENT_CLINIC_OR_DEPARTMENT_OTHER): Payer: Self-pay

## 2024-01-15 ENCOUNTER — Ambulatory Visit: Admitting: Family Medicine

## 2024-01-15 ENCOUNTER — Encounter: Payer: Self-pay | Admitting: Family Medicine

## 2024-01-21 ENCOUNTER — Other Ambulatory Visit: Payer: Self-pay

## 2024-01-21 ENCOUNTER — Other Ambulatory Visit (HOSPITAL_BASED_OUTPATIENT_CLINIC_OR_DEPARTMENT_OTHER): Payer: Self-pay

## 2024-01-21 ENCOUNTER — Emergency Department (HOSPITAL_BASED_OUTPATIENT_CLINIC_OR_DEPARTMENT_OTHER)

## 2024-01-21 ENCOUNTER — Encounter (HOSPITAL_BASED_OUTPATIENT_CLINIC_OR_DEPARTMENT_OTHER): Payer: Self-pay

## 2024-01-21 ENCOUNTER — Emergency Department (HOSPITAL_BASED_OUTPATIENT_CLINIC_OR_DEPARTMENT_OTHER)
Admission: EM | Admit: 2024-01-21 | Discharge: 2024-01-21 | Disposition: A | Discharge status: Home / Self Care | Attending: Emergency Medicine | Admitting: Emergency Medicine

## 2024-01-21 DIAGNOSIS — R1031 Right lower quadrant pain: Secondary | ICD-10-CM | POA: Insufficient documentation

## 2024-01-21 DIAGNOSIS — R1011 Right upper quadrant pain: Secondary | ICD-10-CM | POA: Diagnosis not present

## 2024-01-21 DIAGNOSIS — R10A1 Flank pain, right side: Secondary | ICD-10-CM | POA: Diagnosis present

## 2024-01-21 LAB — COMPREHENSIVE METABOLIC PANEL WITH GFR
ALT: 16 U/L (ref 0–44)
AST: 16 U/L (ref 15–41)
Albumin: 4.6 g/dL (ref 3.5–5.0)
Alkaline Phosphatase: 42 U/L (ref 38–126)
Anion gap: 10 (ref 5–15)
BUN: 12 mg/dL (ref 6–20)
CO2: 25 mmol/L (ref 22–32)
Calcium: 9.3 mg/dL (ref 8.9–10.3)
Chloride: 104 mmol/L (ref 98–111)
Creatinine, Ser: 1.05 mg/dL (ref 0.61–1.24)
GFR, Estimated: >60 mL/min
Glucose, Bld: 87 mg/dL (ref 70–99)
Potassium: 3.8 mmol/L (ref 3.5–5.1)
Sodium: 139 mmol/L (ref 135–145)
Total Bilirubin: 1 mg/dL (ref 0.0–1.2)
Total Protein: 7.2 g/dL (ref 6.5–8.1)

## 2024-01-21 LAB — URINALYSIS, ROUTINE W REFLEX MICROSCOPIC
Bilirubin Urine: NEGATIVE
Glucose, UA: NEGATIVE mg/dL
Hgb urine dipstick: NEGATIVE
Ketones, ur: NEGATIVE mg/dL
Leukocytes,Ua: NEGATIVE
Nitrite: NEGATIVE
Protein, ur: 30 mg/dL — AB
Specific Gravity, Urine: 1.015 (ref 1.005–1.030)
pH: >=9 (ref 5.0–8.0)

## 2024-01-21 LAB — CBC WITH DIFFERENTIAL/PLATELET
Abs Immature Granulocytes: 0.01 K/uL (ref 0.00–0.07)
Basophils Absolute: 0 K/uL (ref 0.0–0.1)
Basophils Relative: 1 %
Eosinophils Absolute: 0 K/uL (ref 0.0–0.5)
Eosinophils Relative: 1 %
HCT: 44.2 % (ref 39.0–52.0)
Hemoglobin: 15.4 g/dL (ref 13.0–17.0)
Immature Granulocytes: 0 %
Lymphocytes Relative: 31 %
Lymphs Abs: 1.7 K/uL (ref 0.7–4.0)
MCH: 28.7 pg (ref 26.0–34.0)
MCHC: 34.8 g/dL (ref 30.0–36.0)
MCV: 82.3 fL (ref 80.0–100.0)
Monocytes Absolute: 0.3 K/uL (ref 0.1–1.0)
Monocytes Relative: 5 %
Neutro Abs: 3.5 K/uL (ref 1.7–7.7)
Neutrophils Relative %: 62 %
Platelets: 190 K/uL (ref 150–400)
RBC: 5.37 MIL/uL (ref 4.22–5.81)
RDW: 12.6 % (ref 11.5–15.5)
WBC: 5.6 K/uL (ref 4.0–10.5)
nRBC: 0 % (ref 0.0–0.2)

## 2024-01-21 LAB — URINALYSIS, MICROSCOPIC (REFLEX)

## 2024-01-21 LAB — LIPASE, BLOOD: Lipase: 31 U/L (ref 11–51)

## 2024-01-21 MED ORDER — ONDANSETRON HCL 4 MG/2ML IJ SOLN
4.0000 mg | Freq: Once | INTRAMUSCULAR | Status: AC
  Administered 2024-01-21: 4 mg via INTRAVENOUS
  Filled 2024-01-21: qty 2

## 2024-01-21 MED ORDER — MORPHINE SULFATE (PF) 4 MG/ML IV SOLN
4.0000 mg | Freq: Once | INTRAVENOUS | Status: AC
  Administered 2024-01-21: 4 mg via INTRAVENOUS
  Filled 2024-01-21: qty 1

## 2024-01-21 MED ORDER — NAPROXEN 500 MG PO TABS
500.0000 mg | ORAL_TABLET | Freq: Three times a day (TID) | ORAL | 0 refills | Status: AC | PRN
  Filled 2024-01-21: qty 15, 5d supply, fill #0

## 2024-01-21 NOTE — ED Notes (Signed)

## 2024-01-21 NOTE — ED Provider Notes (Signed)
 " [place] [facility] AT [facility] Provider Note   CSN: [id number] Arrival date & time: 01/21/24  1636     Patient presents with: Flank Pain   Clayton Avery is a 24 y.o. male.  presents to the ER with right flank pain that started an hour ago.  He states that he ate [employer] and then this pain started.  Patient noted the pain radiates to his right abdomen.  Took Advil  without relief.  Rates the pain a 6 out of 10 at this time denies similar symptoms in the past.  Denies blood in urine.  No history of kidney stones or UTI in the past.  No heart or lung issues.  Denies alcohol or drug use.  Denies abdominal surgeries.  Denies recent travel.  Denies sick contacts. Denies fever, cough, chills, chest pain, shortness of breath, nausea, vomiting, abdominal pain, diarrhea, weakness, urinary symptoms, numbness or tingling, headache, dizziness, or any other symptoms at this time.      Flank Pain       Prior to Admission medications  Medication Sig Start Date End Date Taking? Authorizing Provider  amphetamine -dextroamphetamine  (ADDERALL) 20 MG tablet Take 1 tablet (20 mg total) by mouth 2 (two) times daily. 12/19/23   Billy Philippe SAUNDERS, NP  naproxen  (NAPROSYN ) 500 MG tablet Take 1 tablet (500 mg total) by mouth every 8 (eight) hours as needed for up to 5 days. 01/21/24 01/26/24 Yes Trysten Bernard, PA-C    Allergies: Patient has no known allergies.    Review of Systems  Genitourinary:  Positive for flank pain.    Updated Vital Signs BP 135/73   Pulse 92   Temp 98.2 F (36.8 C)   Resp 18   Ht 6' 1 (1.854 m)   Wt 77.1 kg   SpO2 100%   BMI 22.43 kg/m   Physical Exam Vitals and nursing note reviewed.  Constitutional:      General: He is not in acute distress.    Appearance: Normal appearance. He is well-developed.     Comments: Appears in pain, holding his right flank  HENT:     Head: Normocephalic and atraumatic.  Eyes:     Conjunctiva/sclera:  Conjunctivae normal.  Cardiovascular:     Rate and Rhythm: Normal rate and regular rhythm.     Heart sounds: No murmur heard. Pulmonary:     Effort: Pulmonary effort is normal. No respiratory distress.     Breath sounds: Normal breath sounds.  Abdominal:     Palpations: Abdomen is soft.     Tenderness: There is abdominal tenderness in the right upper quadrant and right lower quadrant. There is right CVA tenderness. There is no left CVA tenderness, guarding or rebound. Negative signs include Murphy's sign, Rovsing's sign, McBurney's sign, psoas sign and obturator sign.  Musculoskeletal:        General: No swelling.     Cervical back: Neck supple.     Comments: Tenderness to left flank with no signs of erythema or swelling of  Skin:    General: Skin is warm and dry.     Capillary Refill: Capillary refill takes less than 2 seconds.     Findings: No rash.  Neurological:     Mental Status: He is alert.  Psychiatric:        Mood and Affect: Mood normal.     (all labs ordered are listed, but only abnormal results are displayed) Labs Reviewed  URINALYSIS, ROUTINE W REFLEX MICROSCOPIC -  Abnormal; Notable for the following components:      Result Value   APPearance HAZY (*)    Protein, ur 30 (*)    All other components within normal limits  URINALYSIS, MICROSCOPIC (REFLEX) - Abnormal; Notable for the following components:   Bacteria, UA RARE (*)    All other components within normal limits  CBC WITH DIFFERENTIAL/PLATELET  COMPREHENSIVE METABOLIC PANEL WITH GFR  LIPASE, BLOOD    EKG: None  Radiology: CT Renal Stone Study Result Date: 01/21/2024 EXAM: CT Abdomen and Pelvis Without Contrast 01/21/2024 06:26:49 PM TECHNIQUE: CT of the abdomen and pelvis was performed without the administration of intravenous contrast. Multiplanar reformatted images are provided for review. Automated exposure control, iterative reconstruction, and/or weight-based adjustment of the mA/kV was utilized to  reduce the radiation dose to as low as reasonably achievable. COMPARISON: CT abdomen and pelvis 11/21/2020. CLINICAL HISTORY: Abdominal/flank pain, stone suspected. FINDINGS: LOWER CHEST: No acute abnormality. LIVER: The liver is unremarkable. GALLBLADDER AND BILE DUCTS: Gallbladder is unremarkable. No biliary ductal dilatation. SPLEEN: No acute abnormality. PANCREAS: No acute abnormality. ADRENAL GLANDS: No acute abnormality. KIDNEYS, URETERS AND BLADDER: No stones in the kidneys or ureters. No hydronephrosis. No perinephric or periureteral stranding. Urinary bladder is unremarkable. GI AND BOWEL: Stomach demonstrates no acute abnormality. There is no bowel obstruction. Appendix appears within normal limits. PERITONEUM AND RETROPERITONEUM: No ascites. No free air. VASCULATURE: Aorta is normal in caliber. LYMPH NODES: No lymphadenopathy. REPRODUCTIVE ORGANS: No acute abnormality. BONES AND SOFT TISSUES: No acute osseous abnormality. No focal soft tissue abnormality. IMPRESSION: 1. No acute findings in the abdomen or pelvis. Electronically signed by: greig pique MD 01/21/2024 06:35 PM EST RP Workstation: [id number]     Procedures   Medications Ordered in the ED  morphine  (PF) 4 MG/ML injection 4 mg (4 mg Intravenous Given 01/21/24 1747)  ondansetron  (ZOFRAN ) injection 4 mg (4 mg Intravenous Given 01/21/24 1747)                                    Medical Decision Making  This patient presents to the ED for concern of right flank pain that started an hour ago. this involves an extensive number of treatment options, and is a complaint that carries with it a high risk of complications and morbidity.  The differential diagnosis includes AAA, mesenteric ischemia, appendicitis, diverticulitis, DKA, gastroenteritis, nephrolithiasis, pyelonephritis, pancreatitis, constipation, UTI, bowel obstruction, biliary disease, IBD, PUD, hepatitis.   Additional history obtained:  Additional history obtained from  EMR  Lab Tests:  I Ordered, and personally interpreted labs.  The pertinent results include: No leukocytosis on CBC, negative CMP.  Normal urinalysis, lipase within normal limits.   Imaging Studies ordered:  I ordered imaging studies including CT renal stone I independently visualized and interpreted imaging which showed no kidney stones or other acute findings I agree with the radiologist interpretation   Cardiac Monitoring: / EKG:  The patient was maintained on a cardiac monitor.  I personally viewed and interpreted the cardiac monitored which showed an underlying rhythm of: Normal sinus   ED Course / Critical interventions / Medication management  I ordered medication including Zofran  and morphine  Reevaluation of the patient after these medicines showed that the patient improved  I have reviewed the patients home medicines and have made adjustments as needed   Dispostion: Patient is a 24 year old male presenting today with right flank pain that radiates to his  abdomen.  Patient states the pain started today after he ate [employer].  He took Advil  without relief.  Denies fevers, chills, hematuria.  On exam he is alert and oriented and appears in pain.  Heart and lungs normal.  Abdomen soft but tender in the right quadrant.  No guarding or rigidity.  Positive right-sided CVA tenderness.  No midline tenderness.  No leg swelling.  Sensation intact.  Mildly hypertensive upon initial evaluation otherwise stable.  No signs of sepsis.  CBC negative without any signs of leukocytosis.  Unremarkable CMP and urinalysis.  Lipase within normal limits.  CT scan did not show any signs of kidney stones or other acute findings.  The results were discussed with the patient.  I I reassured the patient that his pain is most likely benign.  Will prescribe patient naproxen .  Recommended patient apply heat or ice to the area of pain.  Recommended patient to follow-up with primary care doctor for further  evaluation.  Patient was given morphine  and Zofran  here which provided relief.  Repeat vital signs stable.  Tolerating p.o.  Patient was recommended to return for worsening pain, vomiting, blood in urine, shortness of breath, chest pain.  Hemodynamically stable.  Patient is in agreement with plan and is stable for discharge.         Final diagnoses:  Right flank pain    ED Discharge Orders          Ordered    naproxen  (NAPROSYN ) 500 MG tablet  Every 8 hours PRN        01/21/24 1900               Kennith Morss, PA-C 01/21/24 1905    Elnor Jayson LABOR, DO 01/25/24 1534  "

## 2024-01-21 NOTE — Discharge Instructions (Addendum)
 Your workup here was negative today.  CT scan did not show any signs of kidney stones.  Follow-up with your primary care doctor for further evaluation.  Can take naproxen  for pain as needed with food.  Do not extend 1250 mg in a 24-hour. Can also take Tylenol  with this.  Do not exceed 4000 mg of Tylenol . Can also apply heat or ice to the area of pain for 15 to 20 minutes at a time.  Maintain hydration by drinking plenty of water.  Return for worsening fever, severe pain, blood in your urine, nausea or vomiting.

## 2024-01-21 NOTE — ED Triage Notes (Signed)
 C/o right sided flank pain radiating to right abdomen x a few hours with dizziness. Family with hx of kidney stones. States he is unsure of the last time he urinated.

## 2024-02-03 ENCOUNTER — Other Ambulatory Visit (HOSPITAL_BASED_OUTPATIENT_CLINIC_OR_DEPARTMENT_OTHER): Payer: Self-pay
